# Patient Record
Sex: Male | Born: 1985
Health system: Southern US, Community
[De-identification: ages and names within clinical notes are randomized; demographics above are authoritative.]

## PROBLEM LIST (undated history)

## (undated) ENCOUNTER — Emergency Department (HOSPITAL_COMMUNITY)

---

## 2011-10-18 ENCOUNTER — Encounter (HOSPITAL_COMMUNITY): Payer: Self-pay | Admitting: *Deleted

## 2011-10-18 ENCOUNTER — Emergency Department (HOSPITAL_COMMUNITY)
Admission: EM | Admit: 2011-10-18 | Discharge: 2011-10-18 | Discharge status: Against Medical Advice | Payer: Self-pay | Attending: Emergency Medicine | Admitting: Emergency Medicine

## 2011-10-18 DIAGNOSIS — R319 Hematuria, unspecified: Secondary | ICD-10-CM | POA: Insufficient documentation

## 2011-10-18 DIAGNOSIS — R109 Unspecified abdominal pain: Secondary | ICD-10-CM | POA: Insufficient documentation

## 2011-10-18 LAB — CBC WITH DIFFERENTIAL/PLATELET
Basophils Absolute: 0 10*3/uL (ref 0.0–0.1)
Eosinophils Absolute: 0.1 10*3/uL (ref 0.0–0.7)
Lymphocytes Relative: 15 % (ref 12–46)
Lymphs Abs: 1.8 10*3/uL (ref 0.7–4.0)
MCH: 32.7 pg (ref 26.0–34.0)
Neutrophils Relative %: 78 % — ABNORMAL HIGH (ref 43–77)
Platelets: 184 10*3/uL (ref 150–400)
RBC: 3.91 MIL/uL — ABNORMAL LOW (ref 4.22–5.81)
RDW: 11.9 % (ref 11.5–15.5)
WBC: 11.7 10*3/uL — ABNORMAL HIGH (ref 4.0–10.5)

## 2011-10-18 LAB — COMPREHENSIVE METABOLIC PANEL
ALT: 14 U/L (ref 0–53)
AST: 23 U/L (ref 0–37)
Albumin: 4.2 g/dL (ref 3.5–5.2)
Alkaline Phosphatase: 41 U/L (ref 39–117)
Calcium: 9.4 mg/dL (ref 8.4–10.5)
GFR calc Af Amer: >90 mL/min (ref >90)
Glucose, Bld: 94 mg/dL (ref 70–99)
Potassium: 3.8 mEq/L (ref 3.5–5.1)
Sodium: 139 mEq/L (ref 135–145)
Total Protein: 7.6 g/dL (ref 6.0–8.3)

## 2011-10-18 LAB — URINALYSIS, ROUTINE W REFLEX MICROSCOPIC
Nitrite: NEGATIVE
Protein, ur: NEGATIVE mg/dL
Specific Gravity, Urine: 1.024 (ref 1.005–1.030)
Urobilinogen, UA: 1 mg/dL (ref 0.0–1.0)

## 2011-10-18 LAB — URINE MICROSCOPIC-ADD ON

## 2011-10-18 NOTE — ED Notes (Signed)
The pt has had some rt flank and rt abd pain since Saturday.  He thinks he saw blood innhis urine today.  No previous history

## 2011-10-18 NOTE — ED Notes (Signed)
Patient had to leave due to work,  Unable to wait any longer.  He has plans to return in the morning.  Attempted to sign the ama form but signature pad did not work.  He is aware of risks associated with leaving

## 2011-12-01 ENCOUNTER — Emergency Department (INDEPENDENT_AMBULATORY_CARE_PROVIDER_SITE_OTHER)
Admission: EM | Admit: 2011-12-01 | Discharge: 2011-12-01 | Disposition: A | Discharge status: Home / Self Care | Payer: Self-pay | Source: Home / Self Care | Attending: Emergency Medicine | Admitting: Emergency Medicine

## 2011-12-01 ENCOUNTER — Encounter (HOSPITAL_COMMUNITY): Payer: Self-pay | Admitting: *Deleted

## 2011-12-01 DIAGNOSIS — L02416 Cutaneous abscess of left lower limb: Secondary | ICD-10-CM

## 2011-12-01 DIAGNOSIS — L02419 Cutaneous abscess of limb, unspecified: Secondary | ICD-10-CM

## 2011-12-01 MED ORDER — LIDOCAINE HCL (PF) 1 % IJ SOLN
INTRAMUSCULAR | Status: AC
  Filled 2011-12-01: qty 5

## 2011-12-01 MED ORDER — ACETAMINOPHEN 325 MG PO TABS
ORAL_TABLET | ORAL | Status: AC
  Filled 2011-12-01: qty 2

## 2011-12-01 MED ORDER — ACETAMINOPHEN 325 MG PO TABS
650.0000 mg | ORAL_TABLET | Freq: Once | ORAL | Status: AC
Start: 2011-12-01 — End: 2011-12-01
  Administered 2011-12-01: 650 mg via ORAL

## 2011-12-01 MED ORDER — BACITRACIN 500 UNIT/GM EX OINT
1.0000 "application " | TOPICAL_OINTMENT | Freq: Once | CUTANEOUS | Status: AC
  Administered 2011-12-01: 1 via TOPICAL

## 2011-12-01 MED ORDER — HYDROCODONE-ACETAMINOPHEN 5-325 MG PO TABS: 2.0000 | ORAL_TABLET | ORAL | Status: AC | PRN

## 2011-12-01 MED ORDER — IBUPROFEN 600 MG PO TABS: 600.0000 mg | ORAL_TABLET | Freq: Four times a day (QID) | ORAL | Status: AC | PRN

## 2011-12-01 MED ORDER — CEFTRIAXONE SODIUM 1 G IJ SOLR
INTRAMUSCULAR | Status: AC
  Filled 2011-12-01: qty 10

## 2011-12-01 MED ORDER — SULFAMETHOXAZOLE-TRIMETHOPRIM 800-160 MG PO TABS: 1.0000 | ORAL_TABLET | Freq: Two times a day (BID) | ORAL | Status: AC

## 2011-12-01 MED ORDER — CEFTRIAXONE SODIUM 1 G IJ SOLR
1.0000 g | Freq: Once | INTRAMUSCULAR | Status: AC
  Administered 2011-12-01: 1 g via INTRAMUSCULAR

## 2011-12-01 NOTE — ED Notes (Signed)
No adverse reaction to injection.  

## 2011-12-01 NOTE — ED Notes (Signed)
Pt with redness swelling above left knee onset yesterday am - squeezed last night some drainage - whit[place] center

## 2011-12-01 NOTE — ED Provider Notes (Signed)
History     CSN: [id number]  Arrival date & time 12/01/11  1614   First MD Initiated Contact with Patient 12/01/11 1737      Chief Complaint  Patient presents with  . Abscess  . Cellulitis    (Consider location/radiation/quality/duration/timing/severity/associated sxs/prior treatment) HPI Comments: Pt with painful erythematous mass of gradually increasing size starting on his left thigh yesterday. Thinks he was bitten by "something' but is not sure what. Expressed purulent drainage this am. No N/V, fevers at home.  Has not tried anything for pain. Pt is a smoker. Not a diabetic.   ROS as noted in HPI. All other ROS negative.     Patient is a 26 y.o. male presenting with abscess. The history is provided by the patient. No language interpreter was used.  Abscess  This is a new problem. The current episode started yesterday. The onset was sudden. The problem has been gradually worsening. The abscess is present on the left upper leg. The problem is mild. The abscess is characterized by redness and painfulness. It is unknown what he was exposed to. Associated symptoms include a fever.    History reviewed. No pertinent past medical history.  History reviewed. No pertinent past surgical history.  History reviewed. No pertinent family history.  History  Substance Use Topics  . Smoking status: Current Everyday Smoker  . Smokeless tobacco: Not on file  . Alcohol Use: Yes      Review of Systems  Constitutional: Positive for fever.    Allergies  Review of patient's allergies indicates no known allergies.  Home Medications   Current Outpatient Rx  Name Route Sig Dispense Refill  . HYDROCODONE-ACETAMINOPHEN 5-325 MG PO TABS Oral Take 2 tablets by mouth every 4 (four) hours as needed for pain. 20 tablet 0  . IBUPROFEN 600 MG PO TABS Oral Take 1 tablet (600 mg total) by mouth every 6 (six) hours as needed for pain. 30 tablet 0  . SULFAMETHOXAZOLE-TRIMETHOPRIM 800-160 MG PO  TABS Oral Take 1 tablet by mouth 2 (two) times daily. X 10 days 20 tablet 0    BP 129/81  Pulse 90  Temp 101.7 F (38.7 C) (Oral)  Resp 20  SpO2 97%  Physical Exam  Nursing note and vitals reviewed. Constitutional: He is oriented to person, place, and time. He appears well-developed and well-nourished.  HENT:  Head: Normocephalic and atraumatic.  Eyes: Conjunctivae and EOM are normal.  Neck: Normal range of motion.  Cardiovascular: Normal rate.   Pulmonary/Chest: Effort normal. No respiratory distress.  Abdominal: He exhibits no distension.  Musculoskeletal: Normal range of motion.       7x 7.5 cm are induration, erythema tenderness L anterior thigh with central pustule  Neurological: He is alert and oriented to person, place, and time. Coordination normal.  Skin: Skin is warm and dry.  Psychiatric: He has a normal mood and affect. His behavior is normal. Judgment and thought content normal.    ED Course  INCISION AND DRAINAGE Date/Time: 12/01/2011 6:24 PM Performed by: Luiz Blare Authorized by: Luiz Blare Consent: Verbal consent obtained. Consent given by: patient Patient understanding: patient states understanding of the procedure being performed Patient consent: the patient's understanding of the procedure matches consent given Site marked: the operative site was marked Required items: required blood products, implants, devices, and special equipment available Patient identity confirmed: verbally with patient Time out: Immediately prior to procedure a "time out" was called to verify the correct patient, procedure, equipment, support  staff and site/side marked as required. Type: abscess Body area: lower extremity Location details: left leg Local anesthetic: co-phenylcaine spray Patient sedated: no Scalpel size: 11 Incision type: cruciate. Complexity: simple Drainage: purulent Drainage amount: scant Wound treatment: wound left open Patient  tolerance: Patient tolerated the procedure well with no immediate complications. Comments: Sent material off for cx.    (including critical care time)   Labs Reviewed  CULTURE, ROUTINE-ABSCESS   No results found.   1. Abscess of left thigh     MDM  Pt febrile gave tylenol, 1 gm rocephin, home on bactrim. f/u in 2 days. Discussed sx/sn that should prompt earlier return. Patient agrees with plan.    Luiz Blare, MD 12/03/11 2226

## 2011-12-04 LAB — CULTURE, ROUTINE-ABSCESS

## 2011-12-10 ENCOUNTER — Telehealth (HOSPITAL_COMMUNITY): Payer: Self-pay | Admitting: *Deleted

## 2011-12-10 NOTE — ED Notes (Signed)
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  Pt. adequately treated with Septra DS. I called, but pt. was unavailable. I called contact Mom Forde Dandy and left a message for pt. to call. [name] 12/10/2011

## 2011-12-11 ENCOUNTER — Telehealth (HOSPITAL_COMMUNITY): Payer: Self-pay | Admitting: *Deleted

## 2011-12-11 NOTE — ED Notes (Signed)
Pt. called back. Pt. verified x 2 and given results. Pt. told he was adequately treated with Septra DS.  MRSA instructions given.  Pt. voiced understanding. [name] 12/11/2011

## 2018-04-20 ENCOUNTER — Emergency Department
Admission: EM | Admit: 2018-04-20 | Discharge: 2018-04-20 | Disposition: A | Discharge status: Home / Self Care | Payer: Self-pay | Attending: Emergency Medicine | Admitting: Emergency Medicine

## 2018-04-20 ENCOUNTER — Encounter: Payer: Self-pay | Admitting: Medical Oncology

## 2018-04-20 DIAGNOSIS — J111 Influenza due to unidentified influenza virus with other respiratory manifestations: Secondary | ICD-10-CM | POA: Insufficient documentation

## 2018-04-20 DIAGNOSIS — F17200 Nicotine dependence, unspecified, uncomplicated: Secondary | ICD-10-CM | POA: Insufficient documentation

## 2018-04-20 DIAGNOSIS — R69 Illness, unspecified: Secondary | ICD-10-CM

## 2018-04-20 LAB — INFLUENZA PANEL BY PCR (TYPE A & B)
INFLAPCR: NEGATIVE
INFLBPCR: NEGATIVE

## 2018-04-20 MED ORDER — ONDANSETRON 4 MG PO TBDP
4.0000 mg | ORAL_TABLET | Freq: Once | ORAL | Status: AC
  Administered 2018-04-20: 4 mg via ORAL
  Filled 2018-04-20: qty 1

## 2018-04-20 MED ORDER — BENZONATATE 100 MG PO CAPS: ORAL_CAPSULE | ORAL | 0 refills | Status: DC

## 2018-04-20 MED ORDER — ONDANSETRON 4 MG PO TBDP: 4.0000 mg | ORAL_TABLET | Freq: Three times a day (TID) | ORAL | 0 refills | Status: DC | PRN

## 2018-04-20 MED ORDER — PREDNISONE 10 MG PO TABS: 10.0000 mg | ORAL_TABLET | Freq: Two times a day (BID) | ORAL | 0 refills | Status: DC

## 2018-04-20 MED ORDER — OSELTAMIVIR PHOSPHATE 75 MG PO CAPS: 75.0000 mg | ORAL_CAPSULE | Freq: Two times a day (BID) | ORAL | 0 refills | Status: DC

## 2018-04-20 MED ORDER — OSELTAMIVIR PHOSPHATE 75 MG PO CAPS: 75.0000 mg | ORAL_CAPSULE | Freq: Two times a day (BID) | ORAL | 0 refills | Status: AC

## 2018-04-20 NOTE — ED Triage Notes (Signed)
Pt reports flu sx's x 3 days.

## 2018-04-20 NOTE — ED Notes (Signed)
E signature pad not working in room. Pt verbalized understanding of follow-up and discharge instructions as well a medications prescribed

## 2018-04-20 NOTE — ED Provider Notes (Signed)
[facility] Emergency Department Provider Note ____________________________________________  Time seen: 1810  I have reviewed the triage vital signs and the nursing notes.  HISTORY  Chief Complaint  Influenza  HPI Willie Jacobs is a 33 y.o. male presents himself to the ED for evaluation of flulike symptoms.  Patient describes sudden onset of fever at about 1:00 in the morning on he reports a [place]-max of 10105 F.  He took fever reducer at that time is not had recorded fevers at that level since.  He does report subjective fevers, chills, sweats, body aches, and has had intermittent nausea and vomiting.  He did not receive the seasonal flu vaccine.  History reviewed. No pertinent past medical history.  There are no active problems to display for this patient.  No past surgical history on file.  Prior to Admission medications   Medication Sig Start Date End Date Taking? Authorizing Provider  benzonatate (TESSALON PERLES) 100 MG capsule Take 1-2 tabs TID prn cough 04/20/18   Rhayne Chatwin, Charlesetta IvoryJenise V Bacon, PA-C  ondansetron (ZOFRAN ODT) 4 MG disintegrating tablet Take 1 tablet (4 mg total) by mouth every 8 (eight) hours as needed. 04/20/18   Zaryan Yakubov, Charlesetta IvoryJenise V Bacon, PA-C  oseltamivir (TAMIFLU) 75 MG capsule Take 1 capsule (75 mg total) by mouth 2 (two) times daily for 5 days. 04/20/18 04/25/18  Mayling Aber, Charlesetta IvoryJenise V Bacon, PA-C  predniSONE (DELTASONE) 10 MG tablet Take 1 tablet (10 mg total) by mouth 2 (two) times daily with a meal. 04/20/18   Krew Hortman, Charlesetta IvoryJenise V Bacon, PA-C    Allergies Patient has no known allergies.  No family history on file.  Social History Social History   Tobacco Use  . Smoking status: Current Every Day Smoker  Substance Use Topics  . Alcohol use: Yes  . Drug use: No    Review of Systems  Constitutional: Positive for fever. Eyes: Negative for visual changes. ENT: Negative for sore throat. Cardiovascular: Negative for chest pain. Respiratory:  Negative for shortness of breath.  Reports cough and congestion. Gastrointestinal: Negative for abdominal pain, and diarrhea.  Reports nausea and vomiting. Genitourinary: Negative for dysuria. Musculoskeletal: Negative for back pain.  Ports generalized body aches. Skin: Negative for rash. Neurological: Negative for headaches, focal weakness or numbness. ____________________________________________  PHYSICAL EXAM:  VITAL SIGNS: ED Triage Vitals  Enc Vitals Group     BP 04/20/18 1658 122/90     Pulse Rate 04/20/18 1658 (!) 102     Resp 04/20/18 1658 16     Temp 04/20/18 1658 98.2 F (36.8 C)     Temp Source 04/20/18 1658 Oral     SpO2 04/20/18 1658 96 %     Weight 04/20/18 1650 220 lb (99.8 kg)     Height 04/20/18 1650 6\' 2"  (1.88 m)     Head Circumference --      Peak Flow --      Pain Score 04/20/18 1650 9     Pain Loc --      Pain Edu? --      Excl. in GC? --     Constitutional: Alert and oriented. Well appearing and in no distress. Head: Normocephalic and atraumatic. Eyes: Conjunctivae are normal. Normal extraocular movements Neck: Supple. No thyromegaly. Hematological/Lymphatic/Immunological: No cervical lymphadenopathy. Cardiovascular: Normal rate, regular rhythm. Normal distal pulses. Respiratory: Normal respiratory effort. No wheezes/rales/rhonchi. Gastrointestinal: Soft and nontender. No distention. Musculoskeletal: Nontender with normal range of motion in all extremities.  Neurologic:  Normal gait without ataxia. Normal speech  and language. No gross focal neurologic deficits are appreciated. Skin:  Skin is warm, dry and intact. No rash noted. ____________________________________________   LABS (pertinent positives/negatives) Labs Reviewed  INFLUENZA PANEL BY PCR (TYPE A & B)  ____________________________________________  PROCEDURES  Procedures Zofran 4 mg DOT ____________________________________________  INITIAL IMPRESSION / ASSESSMENT AND PLAN / ED  COURSE  Patient with ED evaluation of flulike symptoms.  Patient presents with reports of fevers, body aches, nausea, vomiting, and chills.  Was influenza screen is negative at this time.  Patient will be treated empirically for influenza.  A prescription for Tamiflu is provided to take as directed.  He is also given prescriptions for Tessalon Perles, Zofran, and prednisone.  He is encouraged to set up routine care 1 of the local community clinics for ongoing symptoms.  Return to the ED as needed. ____________________________________________  FINAL CLINICAL IMPRESSION(S) / ED DIAGNOSES  Final diagnoses:  Influenza-like illness      Karmen Stabs, Charlesetta Ivory, PA-C 04/20/18 Kermit Balo, MD 04/20/18 [phone]

## 2018-04-20 NOTE — Discharge Instructions (Addendum)
You are being treated for probable influenza (flu). Take the prescription Tamiflu as directed. Take the other meds as directed. Follow-up with one of the local community clinics as needed. You may take Delsym, DayQuil, and NyQuil in addition. Return to the ED as needed.

## 2018-04-23 ENCOUNTER — Ambulatory Visit (HOSPITAL_COMMUNITY)
Admission: EM | Admit: 2018-04-23 | Discharge: 2018-04-23 | Disposition: A | Discharge status: Home / Self Care | Payer: Self-pay | Attending: Family Medicine | Admitting: Family Medicine

## 2018-04-23 ENCOUNTER — Encounter (HOSPITAL_COMMUNITY): Payer: Self-pay

## 2018-04-23 DIAGNOSIS — S40021A Contusion of right upper arm, initial encounter: Secondary | ICD-10-CM

## 2018-04-23 DIAGNOSIS — M542 Cervicalgia: Secondary | ICD-10-CM

## 2018-04-23 MED ORDER — NAPROXEN 500 MG PO TABS: 500.0000 mg | ORAL_TABLET | Freq: Two times a day (BID) | ORAL | 0 refills | Status: DC
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
## 2018-04-23 NOTE — ED Notes (Signed)
Patient able to ambulate independently  
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
## 2018-04-23 NOTE — ED Notes (Signed)
Bed: UC01 Expected date:  Expected time:  Means of arrival:  Comments: [place]

## 2018-05-03 ENCOUNTER — Encounter (HOSPITAL_COMMUNITY): Payer: Self-pay

## 2018-05-03 ENCOUNTER — Other Ambulatory Visit: Payer: Self-pay

## 2018-05-03 ENCOUNTER — Ambulatory Visit (HOSPITAL_COMMUNITY)
Admission: EM | Admit: 2018-05-03 | Discharge: 2018-05-03 | Disposition: A | Discharge status: Home / Self Care | Payer: Self-pay | Attending: Family Medicine | Admitting: Family Medicine

## 2018-05-03 DIAGNOSIS — M62830 Muscle spasm of back: Secondary | ICD-10-CM

## 2018-05-03 MED ORDER — CYCLOBENZAPRINE HCL 10 MG PO TABS: 10.0000 mg | ORAL_TABLET | Freq: Every day | ORAL | 0 refills | Status: DC

## 2018-05-03 NOTE — Discharge Instructions (Signed)
Rest, ice heat, and gentle massage as needed Ensure adequate ROM as tolerated. Injuries all appear to be muscular in nature. Continue with naproxen Prescribed flexeril as needed at bedtime for muscle spasm.  Do not drive or operate heavy machinery while taking this medication It may take 3-4 weeks for complete resolution of symptoms Will f/u with his doctor or here if not seeing significant improvement within one week. Return here or go to ER if you have any new or worsening symptoms such as numbness/tingling of the inner thighs, loss of bladder or bowel control, headache/blurry vision, nausea/vomiting, confusion/altered mental status, dizziness, weakness, passing out, imbalance, etc..Marland Kitchen
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
## 2018-05-03 NOTE — ED Notes (Signed)
Patient requested work note be extended one day, brittany, [place] agreed

## 2018-05-03 NOTE — ED Notes (Signed)
Stated to patient that we do not fill out "fmla or family leave absence" paperwork.  Encouraged patient to talk to employer and make sure this was not an expectation.  Patient stated understanding.
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
## 2019-03-23 ENCOUNTER — Ambulatory Visit: Payer: Self-pay

## 2019-09-07 ENCOUNTER — Encounter (HOSPITAL_COMMUNITY): Payer: Self-pay | Admitting: Emergency Medicine

## 2019-09-07 ENCOUNTER — Ambulatory Visit (HOSPITAL_COMMUNITY)
Admission: EM | Admit: 2019-09-07 | Discharge: 2019-09-07 | Disposition: A | Discharge status: Home / Self Care | Payer: Self-pay | Attending: Family Medicine | Admitting: Family Medicine

## 2019-09-07 ENCOUNTER — Other Ambulatory Visit: Payer: Self-pay

## 2019-09-07 DIAGNOSIS — R112 Nausea with vomiting, unspecified: Secondary | ICD-10-CM

## 2019-09-07 MED ORDER — ONDANSETRON 4 MG PO TBDP: 4.0000 mg | ORAL_TABLET | Freq: Three times a day (TID) | ORAL | 0 refills | Status: DC | PRN

## 2019-09-07 NOTE — Discharge Instructions (Signed)
Please do your best to ensure adequate fluid intake in order to avoid dehydration. If you find that you are unable to tolerate drinking fluids regularly please proceed to the Emergency Department for evaluation.  Also, you should return to the hospital if you experience abdominal pain that persists despite medications, persistent vomiting and diarrhea, dizziness, syncope (fainting), or for any other concerns you may find worrisome.

## 2019-09-07 NOTE — ED Triage Notes (Signed)
Pt states over the weekend he had a stomach virus over the weekend and vomitted twice. No other symptoms. Denies any nausea/diarrhea. Pt states he feels better today but does need a work note.

## 2019-09-09 NOTE — ED Provider Notes (Signed)
[facility]   [id number] 09/07/19 Arrival Time: 7342  ASSESSMENT & PLAN:  1. Non-intractable vomiting with nausea, unspecified vomiting type     Work note provided.   Discharge Instructions     Please do your best to ensure adequate fluid intake in order to avoid dehydration. If you find that you are unable to tolerate drinking fluids regularly please proceed to the Emergency Department for evaluation.  Also, you should return to the hospital if you experience abdominal pain that persists despite medications, persistent vomiting and diarrhea, dizziness, syncope (fainting), or for any other concerns you may find worrisome.      Meds ordered this encounter  Medications  . ondansetron (ZOFRAN-ODT) 4 MG disintegrating tablet    Sig: Take 1 tablet (4 mg total) by mouth every 8 (eight) hours as needed for nausea or vomiting.    Dispense:  15 tablet    Refill:  0    Discussed typical duration of symptoms for suspected viral GI illness. Will do his best to ensure adequate fluid intake in order to avoid dehydration. Will proceed to the Emergency Department for evaluation if unable to tolerate PO fluids regularly.  Otherwise he will f/u with his PCP or here if not showing improvement over the next 48-72 hours.  Reviewed expectations re: course of current medical issues. Questions answered. Outlined signs and symptoms indicating need for more acute intervention. Patient verbalized understanding. After Visit Summary given.   SUBJECTIVE: History from: patient.  Willie Jacobs is a 34 y.o. male who reports that he "had a stomach virus over the weekend"; nausea/vomiting without diarrhea. Overall feeling better. Tolerating PO fluids with just occasional and mild nausea; no further emesis. No abdominal pain reported. Afebrile. No specific aggravating or alleviating factors reported. No known sick contacts. Needs a note to return to work.    OBJECTIVE:  Vitals:   09/07/19 1315  BP: 115/71  Pulse: 72  Resp: 14  Temp: 98.5 F (36.9 C)  TempSrc: Oral  SpO2: 100%    General appearance: alert; no distress Oropharynx: moist Lungs: speaks full sentences without difficutly; unlabored Abdomen: soft; non-distended; no significant abdominal tenderness; bowel sounds present; no masses or organomegaly; no guarding or rebound tenderness Back: no CVA tenderness reported Extremities: no edema; symmetrical with no gross deformities Skin: warm; dry Neurologic: normal gait Psychological: alert and cooperative; normal mood and affect   No Known Allergies                                             History reviewed. No pertinent past medical history. Social History   Socioeconomic History  . Marital status: Single    Spouse name: Not on file  . Number of children: Not on file  . Years of education: Not on file  . Highest education level: Not on file  Occupational History  . Not on file  Tobacco Use  . Smoking status: Current Every Day Smoker  . Smokeless tobacco: Never Used  Substance and Sexual Activity  . Alcohol use: Yes  . Drug use: No  . Sexual activity: Not on file  Other Topics Concern  . Not on file  Social History Narrative  . Not on file   Social Determinants of Health   Financial Resource Strain:   . Difficulty of Paying Living Expenses:   Food Insecurity:   . Worried About  Running Out of Food in the Last Year:   . Ran Out of Food in the Last Year:   Transportation Needs:   . Lack of Transportation (Medical):   Marland Kitchen Lack of Transportation (Non-Medical):   Physical Activity:   . Days of Exercise per Week:   . Minutes of Exercise per Session:   Stress:   . Feeling of Stress :   Social Connections:   . Frequency of Communication with Friends and Family:   . Frequency of Social Gatherings with Friends and Family:   . Attends Religious Services:   . Active Member of Clubs or Organizations:   . Attends Banker  Meetings:   Marland Kitchen Marital Status:   Intimate Partner Violence:   . Fear of Current or Ex-Partner:   . Emotionally Abused:   Marland Kitchen Physically Abused:   . Sexually Abused:    History reviewed. No pertinent family history.   Mardella Layman, MD 09/09/19 1419

## 2020-03-29 ENCOUNTER — Ambulatory Visit (HOSPITAL_COMMUNITY)
Admission: EM | Admit: 2020-03-29 | Discharge: 2020-03-29 | Disposition: A | Discharge status: Home / Self Care | Payer: Self-pay | Attending: Family Medicine | Admitting: Family Medicine

## 2020-03-29 ENCOUNTER — Encounter (HOSPITAL_COMMUNITY): Payer: Self-pay

## 2020-03-29 ENCOUNTER — Other Ambulatory Visit: Payer: Self-pay

## 2020-03-29 DIAGNOSIS — S20211A Contusion of right front wall of thorax, initial encounter: Secondary | ICD-10-CM

## 2020-03-29 MED ORDER — IBUPROFEN 800 MG PO TABS: 800.0000 mg | ORAL_TABLET | Freq: Three times a day (TID) | ORAL | 0 refills | Status: DC

## 2020-03-29 NOTE — ED Triage Notes (Signed)
Pt presents right side pain. Pt states he was wrestling with his brother today and injured himself.
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
## 2021-01-25 ENCOUNTER — Other Ambulatory Visit: Payer: Self-pay

## 2021-01-25 ENCOUNTER — Emergency Department (HOSPITAL_COMMUNITY)
Admission: EM | Admit: 2021-01-25 | Discharge: 2021-01-25 | Disposition: A | Discharge status: Home / Self Care | Payer: No Typology Code available for payment source | Attending: Emergency Medicine | Admitting: Emergency Medicine

## 2021-01-25 ENCOUNTER — Encounter (HOSPITAL_COMMUNITY): Payer: Self-pay | Admitting: General Surgery

## 2021-01-25 ENCOUNTER — Emergency Department (HOSPITAL_COMMUNITY): Payer: No Typology Code available for payment source

## 2021-01-25 DIAGNOSIS — R7402 Elevation of levels of lactic acid dehydrogenase (LDH): Secondary | ICD-10-CM | POA: Diagnosis not present

## 2021-01-25 DIAGNOSIS — Y908 Blood alcohol level of 240 mg/100 ml or more: Secondary | ICD-10-CM | POA: Diagnosis not present

## 2021-01-25 DIAGNOSIS — Z23 Encounter for immunization: Secondary | ICD-10-CM | POA: Diagnosis not present

## 2021-01-25 DIAGNOSIS — F10129 Alcohol abuse with intoxication, unspecified: Secondary | ICD-10-CM | POA: Insufficient documentation

## 2021-01-25 DIAGNOSIS — Z20822 Contact with and (suspected) exposure to covid-19: Secondary | ICD-10-CM | POA: Diagnosis not present

## 2021-01-25 DIAGNOSIS — E876 Hypokalemia: Secondary | ICD-10-CM | POA: Insufficient documentation

## 2021-01-25 DIAGNOSIS — S2232XA Fracture of one rib, left side, initial encounter for closed fracture: Secondary | ICD-10-CM | POA: Insufficient documentation

## 2021-01-25 DIAGNOSIS — S3993XA Unspecified injury of pelvis, initial encounter: Secondary | ICD-10-CM | POA: Diagnosis not present

## 2021-01-25 DIAGNOSIS — F10920 Alcohol use, unspecified with intoxication, uncomplicated: Secondary | ICD-10-CM

## 2021-01-25 DIAGNOSIS — R4182 Altered mental status, unspecified: Secondary | ICD-10-CM | POA: Diagnosis not present

## 2021-01-25 DIAGNOSIS — S42154A Nondisplaced fracture of neck of scapula, right shoulder, initial encounter for closed fracture: Secondary | ICD-10-CM | POA: Diagnosis not present

## 2021-01-25 DIAGNOSIS — S42301A Unspecified fracture of shaft of humerus, right arm, initial encounter for closed fracture: Secondary | ICD-10-CM | POA: Diagnosis not present

## 2021-01-25 DIAGNOSIS — M25511 Pain in right shoulder: Secondary | ICD-10-CM | POA: Diagnosis not present

## 2021-01-25 DIAGNOSIS — S42144A Nondisplaced fracture of glenoid cavity of scapula, right shoulder, initial encounter for closed fracture: Secondary | ICD-10-CM | POA: Insufficient documentation

## 2021-01-25 DIAGNOSIS — Y92410 Unspecified street and highway as the place of occurrence of the external cause: Secondary | ICD-10-CM | POA: Diagnosis not present

## 2021-01-25 DIAGNOSIS — S4991XA Unspecified injury of right shoulder and upper arm, initial encounter: Secondary | ICD-10-CM | POA: Diagnosis present

## 2021-01-25 DIAGNOSIS — R7989 Other specified abnormal findings of blood chemistry: Secondary | ICD-10-CM

## 2021-01-25 DIAGNOSIS — S42141A Displaced fracture of glenoid cavity of scapula, right shoulder, initial encounter for closed fracture: Secondary | ICD-10-CM

## 2021-01-25 DIAGNOSIS — S42151A Displaced fracture of neck of scapula, right shoulder, initial encounter for closed fracture: Secondary | ICD-10-CM

## 2021-01-25 LAB — URINALYSIS, ROUTINE W REFLEX MICROSCOPIC
Bilirubin Urine: NEGATIVE
Glucose, UA: NEGATIVE mg/dL
Ketones, ur: NEGATIVE mg/dL
Leukocytes,Ua: NEGATIVE
Nitrite: NEGATIVE
Protein, ur: NEGATIVE mg/dL
Specific Gravity, Urine: 1.039 — ABNORMAL HIGH (ref 1.005–1.030)
pH: 6 (ref 5.0–8.0)

## 2021-01-25 LAB — SAMPLE TO BLOOD BANK

## 2021-01-25 LAB — RESP PANEL BY RT-PCR (FLU A&B, COVID) ARPGX2
Influenza A by PCR: NEGATIVE
Influenza B by PCR: NEGATIVE
SARS Coronavirus 2 by RT PCR: NEGATIVE

## 2021-01-25 LAB — COMPREHENSIVE METABOLIC PANEL
ALT: 24 U/L (ref 0–44)
AST: 37 U/L (ref 15–41)
Albumin: 4.1 g/dL (ref 3.5–5.0)
Alkaline Phosphatase: 46 U/L (ref 38–126)
Anion gap: 12 (ref 5–15)
BUN: 8 mg/dL (ref 6–20)
CO2: 22 mmol/L (ref 22–32)
Calcium: 8.7 mg/dL — ABNORMAL LOW (ref 8.9–10.3)
Chloride: 104 mmol/L (ref 98–111)
Creatinine, Ser: 1 mg/dL (ref 0.61–1.24)
GFR, Estimated: >60 mL/min (ref >60)
Glucose, Bld: 125 mg/dL — ABNORMAL HIGH (ref 70–99)
Potassium: 3.3 mmol/L — ABNORMAL LOW (ref 3.5–5.1)
Sodium: 138 mmol/L (ref 135–145)
Total Bilirubin: 0.7 mg/dL (ref 0.3–1.2)
Total Protein: 7.6 g/dL (ref 6.5–8.1)

## 2021-01-25 LAB — I-STAT CHEM 8, ED
BUN: 8 mg/dL (ref 6–20)
Calcium, Ion: 0.9 mmol/L — ABNORMAL LOW (ref 1.15–1.40)
Chloride: 107 mmol/L (ref 98–111)
Creatinine, Ser: 1.4 mg/dL — ABNORMAL HIGH (ref 0.61–1.24)
Glucose, Bld: 121 mg/dL — ABNORMAL HIGH (ref 70–99)
HCT: 45 % (ref 39.0–52.0)
Hemoglobin: 15.3 g/dL (ref 13.0–17.0)
Potassium: 3.3 mmol/L — ABNORMAL LOW (ref 3.5–5.1)
Sodium: 142 mmol/L (ref 135–145)
TCO2: 23 mmol/L (ref 22–32)

## 2021-01-25 LAB — LACTIC ACID, PLASMA: Lactic Acid, Venous: 3 mmol/L (ref 0.5–1.9)

## 2021-01-25 LAB — PROTIME-INR
INR: 1 (ref 0.8–1.2)
Prothrombin Time: 13.4 seconds (ref 11.4–15.2)

## 2021-01-25 LAB — CBC
HCT: 42.3 % (ref 39.0–52.0)
Hemoglobin: 14.2 g/dL (ref 13.0–17.0)
MCH: 33.8 pg (ref 26.0–34.0)
MCHC: 33.6 g/dL (ref 30.0–36.0)
MCV: 100.7 fL — ABNORMAL HIGH (ref 80.0–100.0)
Platelets: 254 10*3/uL (ref 150–400)
RBC: 4.2 MIL/uL — ABNORMAL LOW (ref 4.22–5.81)
RDW: 11.7 % (ref 11.5–15.5)
WBC: 13.6 10*3/uL — ABNORMAL HIGH (ref 4.0–10.5)
nRBC: 0 % (ref 0.0–0.2)

## 2021-01-25 LAB — ETHANOL: Alcohol, Ethyl (B): 277 mg/dL — ABNORMAL HIGH (ref <10)

## 2021-01-25 MED ORDER — POTASSIUM CHLORIDE CRYS ER 20 MEQ PO TBCR: 20.0000 meq | EXTENDED_RELEASE_TABLET | Freq: Two times a day (BID) | ORAL | 0 refills | Status: DC

## 2021-01-25 MED ORDER — IOHEXOL 300 MG/ML  SOLN
80.0000 mL | Freq: Once | INTRAMUSCULAR | Status: AC | PRN
  Administered 2021-01-25: 80 mL via INTRAVENOUS

## 2021-01-25 MED ORDER — TETANUS-DIPHTH-ACELL PERTUSSIS 5-2.5-18.5 LF-MCG/0.5 IM SUSY
0.5000 mL | PREFILLED_SYRINGE | Freq: Once | INTRAMUSCULAR | Status: AC
  Administered 2021-01-25: 0.5 mL via INTRAMUSCULAR
  Filled 2021-01-25: qty 0.5

## 2021-01-25 MED ORDER — HYDROCODONE-ACETAMINOPHEN 5-325 MG PO TABS: 1.0000 | ORAL_TABLET | Freq: Four times a day (QID) | ORAL | 0 refills | Status: DC | PRN

## 2021-01-25 MED ORDER — LACTATED RINGERS IV BOLUS
2000.0000 mL | Freq: Once | INTRAVENOUS | Status: AC
  Administered 2021-01-25: 2000 mL via INTRAVENOUS

## 2021-01-25 MED ORDER — SODIUM CHLORIDE 0.9 % IV SOLN
INTRAVENOUS | Status: DC | PRN
  Administered 2021-01-25: 1000 mL via INTRAVENOUS
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
## 2021-01-25 NOTE — ED Notes (Signed)
Pt sitting on side of head eating [place] sandwich.
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
## 2021-01-25 NOTE — ED Notes (Addendum)
Pt transported to CT 2 by [facility]
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
## 2021-01-25 NOTE — ED Notes (Signed)
Attempts made to contact pts sister French Ana, [phone]. LM
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
## 2021-01-25 NOTE — Progress Notes (Signed)
   01/25/21 0400  Clinical Encounter Type  Visited With Patient not available  Visit Type Trauma  Referral From Nurse  Consult/Referral To Chaplain   Chaplain responded. The medical team is attending to the patient. There is no support person here at this time. Advise that the Chaplain remains available for follow-up spiritual and emotional support as needed.This note was prepared by Deneen Harts, M.Div..  For questions please contact by phone [phone].

## 2021-01-26 ENCOUNTER — Encounter (HOSPITAL_COMMUNITY): Payer: Self-pay

## 2021-01-28 ENCOUNTER — Other Ambulatory Visit: Payer: Self-pay

## 2021-01-28 ENCOUNTER — Encounter (HOSPITAL_COMMUNITY): Payer: Self-pay | Admitting: Emergency Medicine

## 2021-01-28 ENCOUNTER — Ambulatory Visit (HOSPITAL_COMMUNITY)
Admission: EM | Admit: 2021-01-28 | Discharge: 2021-01-28 | Disposition: A | Discharge status: Home / Self Care | Payer: Medicaid Other | Attending: Emergency Medicine | Admitting: Emergency Medicine

## 2021-01-28 DIAGNOSIS — S42411S Displaced simple supracondylar fracture without intercondylar fracture of right humerus, sequela: Secondary | ICD-10-CM

## 2021-01-28 DIAGNOSIS — S42411D Displaced simple supracondylar fracture without intercondylar fracture of right humerus, subsequent encounter for fracture with routine healing: Secondary | ICD-10-CM

## 2021-01-28 MED ORDER — IBUPROFEN 800 MG PO TABS: 800.0000 mg | ORAL_TABLET | Freq: Three times a day (TID) | ORAL | 0 refills | Status: AC

## 2021-01-28 NOTE — Discharge Instructions (Addendum)
Please begin ibuprofen 3 times daily for pain.  Please follow-up with orthopedics as scheduled
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
## 2021-01-28 NOTE — Progress Notes (Signed)
Orthopedic Tech Progress Note Patient Details:  Willie Jacobs [birth year] [id number]  The anterior long arm splint applied by Dr. Preston Fleeting in the ED on 01/25/21 was removed and replaced with a well-padded, plaster Coaptation splint and arm sling. Pt's discomfort level decreased significantly with removal and application of a different splint and also noted no areas of discomfort or irritation from the splint.   Ortho Devices Type of Ortho Device: Arm sling, Cotton web roll, Coapt, Ace wrap Ortho Device/Splint Location: RUE Ortho Device/Splint Interventions: Ordered, Application, Removal   Post Interventions Patient Tolerated: Fair Instructions Provided: Care of device, Adjustment of device, Poper ambulation with device  Docia Furl 01/28/2021, 6:25 PM

## 2021-01-28 NOTE — ED Notes (Signed)
Called ortho tech and made aware order for long arm splint to be casted.
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
## 2021-04-12 NOTE — Therapy (Incomplete)
OUTPATIENT PHYSICAL THERAPY SHOULDER EVALUATION   Patient Name: Willie Jacobs MRN: [id number] DOB:02/21/1986, 36 y.o., male Today's Date: 04/12/2021    No past medical history on file. No past surgical history on file. There are no problems to display for this patient.   PCP: Pcp, No  REFERRING PROVIDER: Terance Hart, MD  REFERRING DIAG: RIGHT HUMERUS FX  THERAPY DIAG:  No diagnosis found.   ONSET DATE: 01/25/2021  SUBJECTIVE:                                                                                                                                                                                      SUBJECTIVE STATEMENT: ***  PERTINENT HISTORY: ***  PAIN:  Are you having pain? {yes/no:20286} NPRS scale: ***/10 Pain location: *** Pain orientation: {Pain Orientation:25161}  PAIN TYPE: {type:313116} Pain description: {PAIN DESCRIPTION:21022940}  Aggravating factors: *** Relieving factors: ***  PRECAUTIONS: {Therapy precautions:24002}  WEIGHT BEARING RESTRICTIONS {Yes ***/No:24003}  FALLS:  Has patient fallen in last 6 months? {yes/no:20286} Number of falls: ***  LIVING ENVIRONMENT: Lives with: {[facility] lives with:25569::"lives with their family"} Lives in: {Lives in:25570} Stairs: {yes/no:20286}; {Stairs:24000} Has following equipment at home: {Assistive devices:23999}  OCCUPATION: ***  PLOF: {PLOF:24004}  PATIENT GOALS ***  OBJECTIVE:   DIAGNOSTIC FINDINGS:  01/25/2021 DG Humerus Right: IMPRESSION: 1. Acute angulated transverse fracture involving the mid-distal right humeral shaft. 2. Question additional lucency traversing the glenoid, which could reflect an additional acute nondisplaced fracture. Further assessment with dedicated radiograph of the right shoulder suggested.  01/25/2021 DG Shoulder Right: IMPRESSION: 1. Subtle nondisplaced fracture of the right scapula extending from the glenoid into the body of the scapula,  well appreciated on the CT of the chest 01/25/2021. 2. Acute minimally comminuted displaced and angulated fracture of the mid right humeral diaphysis again noted.   COGNITION:  Overall cognitive status: {cognition:24006}     SENSATION:  Light touch: {intact/deficits:24005}    POSTURE: ***  PALPATION: ***  UPPER EXTREMITY AROM/PROM:  A/PROM Right 04/12/2021 Left 04/12/2021  Shoulder flexion    Shoulder extension    Shoulder abduction    Shoulder adduction    Shoulder internal rotation    Shoulder external rotation    Elbow flexion    Elbow extension    Wrist flexion    Wrist extension    Wrist ulnar deviation    Wrist radial deviation    Wrist pronation    Wrist supination    (Blank rows = not tested)  UPPER EXTREMITY MMT:  MMT Right 04/12/2021 Left 04/12/2021  Shoulder flexion    Shoulder extension    Shoulder abduction    Shoulder adduction  Middle trapezius    Lower trapezius    Elbow flexion    Elbow extension    Wrist flexion    Wrist extension    Wrist ulnar deviation    Wrist radial deviation    Wrist pronation    Wrist supination    Grip strength (lbs)    (Blank rows = not tested)   JOINT MOBILITY TESTING:  ***  PALPATION:  ***  POSTURE:  ***   TODAY'S TREATMENT:  ***   PATIENT EDUCATION: Education details: *** Person educated: {Person educated:25204} Education method: {Education Method:25205} Education comprehension: {Education Comprehension:25206}   HOME EXERCISE PROGRAM: ***  ASSESSMENT:  CLINICAL IMPRESSION: Patient is a *** y.o. *** who was seen today for physical therapy evaluation and treatment for ***. Objective impairments include {opptimpairments:25111}. These impairments are limiting patient from {activity limitations:25113}. Personal factors including {Personal factors:25162} are also affecting patient's functional outcome. Patient will benefit from skilled PT to address above impairments and improve overall  function.  REHAB POTENTIAL: {rehabpotential:25112}  CLINICAL DECISION MAKING: {clinical decision making:25114}  EVALUATION COMPLEXITY: {Evaluation complexity:25115}   GOALS: Goals reviewed with patient? {yes/no:20286}  SHORT TERM GOALS:  STG Name Target Date Goal status  1 *** Baseline:  {follow up:25551} {GOALSTATUS:25110}  2 *** Baseline:  {follow up:25551} {GOALSTATUS:25110}  3 *** Baseline: {follow up:25551} {GOALSTATUS:25110}  4 *** Baseline: {follow up:25551} {GOALSTATUS:25110}  5 *** Baseline: {follow up:25551} {GOALSTATUS:25110}  6 *** Baseline: {follow up:25551} {GOALSTATUS:25110}  7 *** Baseline: {follow up:25551} {GOALSTATUS:25110}   LONG TERM GOALS:   LTG Name Target Date Goal status  1 *** Baseline: {follow up:25551} {GOALSTATUS:25110}  2 *** Baseline: {follow up:25551} {GOALSTATUS:25110}  3 *** Baseline: {follow up:25551} {GOALSTATUS:25110}  4 *** Baseline: {follow up:25551} {GOALSTATUS:25110}  5 *** Baseline: {follow up:25551} {GOALSTATUS:25110}  6 *** Baseline: {follow up:25551} {GOALSTATUS:25110}  7 *** Baseline: {follow up:25551} {GOALSTATUS:25110}   PLAN: PT FREQUENCY: {rehab frequency:25116}  PT DURATION: {rehab duration:25117}  PLANNED INTERVENTIONS: {rehab planned interventions:25118::"Therapeutic exercises","Therapeutic activity","Neuro Muscular re-education","Balance training","Gait training","Patient/Family education","Joint mobilization"}  PLAN FOR NEXT SESSION: Carmelina Dane, PT, DPT 04/12/21 1:09 PM

## 2021-04-13 ENCOUNTER — Ambulatory Visit: Payer: Medicaid Other | Attending: Orthopaedic Surgery
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
## 2024-01-30 ENCOUNTER — Inpatient Hospital Stay (HOSPITAL_COMMUNITY)
Admission: EM | Admit: 2024-01-30 | Discharge: 2024-02-05 | DRG: 981 | Disposition: A | Discharge status: Home / Self Care | Attending: General Surgery | Admitting: General Surgery

## 2024-01-30 ENCOUNTER — Other Ambulatory Visit: Payer: Self-pay

## 2024-01-30 ENCOUNTER — Inpatient Hospital Stay (HOSPITAL_COMMUNITY): Admitting: Anesthesiology

## 2024-01-30 ENCOUNTER — Encounter (HOSPITAL_COMMUNITY): Admission: EM | Disposition: A | Discharge status: Home / Self Care | Payer: Self-pay | Source: Home / Self Care

## 2024-01-30 ENCOUNTER — Inpatient Hospital Stay (HOSPITAL_COMMUNITY)

## 2024-01-30 ENCOUNTER — Emergency Department (HOSPITAL_COMMUNITY): Payer: Self-pay

## 2024-01-30 ENCOUNTER — Emergency Department (HOSPITAL_COMMUNITY)

## 2024-01-30 DIAGNOSIS — J942 Hemothorax: Principal | ICD-10-CM

## 2024-01-30 DIAGNOSIS — S27331A Laceration of lung, unilateral, initial encounter: Secondary | ICD-10-CM | POA: Diagnosis not present

## 2024-01-30 DIAGNOSIS — Y249XXA Unspecified firearm discharge, undetermined intent, initial encounter: Secondary | ICD-10-CM

## 2024-01-30 DIAGNOSIS — T796XXA Traumatic ischemia of muscle, initial encounter: Secondary | ICD-10-CM | POA: Diagnosis present

## 2024-01-30 DIAGNOSIS — Y906 Blood alcohol level of 120-199 mg/100 ml: Secondary | ICD-10-CM | POA: Diagnosis present

## 2024-01-30 DIAGNOSIS — S2242XA Multiple fractures of ribs, left side, initial encounter for closed fracture: Secondary | ICD-10-CM | POA: Diagnosis present

## 2024-01-30 DIAGNOSIS — S272XXA Traumatic hemopneumothorax, initial encounter: Principal | ICD-10-CM | POA: Diagnosis present

## 2024-01-30 DIAGNOSIS — S27322A Contusion of lung, bilateral, initial encounter: Secondary | ICD-10-CM | POA: Diagnosis present

## 2024-01-30 DIAGNOSIS — F1024 Alcohol dependence with alcohol-induced mood disorder: Secondary | ICD-10-CM | POA: Diagnosis present

## 2024-01-30 DIAGNOSIS — S2193XA Puncture wound without foreign body of unspecified part of thorax, initial encounter: Secondary | ICD-10-CM | POA: Diagnosis not present

## 2024-01-30 DIAGNOSIS — Y846 Urinary catheterization as the cause of abnormal reaction of the patient, or of later complication, without mention of misadventure at the time of the procedure: Secondary | ICD-10-CM | POA: Diagnosis not present

## 2024-01-30 DIAGNOSIS — E876 Hypokalemia: Secondary | ICD-10-CM | POA: Diagnosis present

## 2024-01-30 DIAGNOSIS — D696 Thrombocytopenia, unspecified: Secondary | ICD-10-CM | POA: Diagnosis not present

## 2024-01-30 DIAGNOSIS — D62 Acute posthemorrhagic anemia: Secondary | ICD-10-CM | POA: Diagnosis present

## 2024-01-30 DIAGNOSIS — J9601 Acute respiratory failure with hypoxia: Secondary | ICD-10-CM | POA: Diagnosis present

## 2024-01-30 DIAGNOSIS — R319 Hematuria, unspecified: Secondary | ICD-10-CM | POA: Diagnosis not present

## 2024-01-30 DIAGNOSIS — J939 Pneumothorax, unspecified: Secondary | ICD-10-CM | POA: Diagnosis not present

## 2024-01-30 DIAGNOSIS — F430 Acute stress reaction: Secondary | ICD-10-CM | POA: Diagnosis not present

## 2024-01-30 DIAGNOSIS — S21132A Puncture wound without foreign body of left front wall of thorax without penetration into thoracic cavity, initial encounter: Secondary | ICD-10-CM | POA: Diagnosis present

## 2024-01-30 DIAGNOSIS — Z9911 Dependence on respirator [ventilator] status: Secondary | ICD-10-CM | POA: Diagnosis not present

## 2024-01-30 DIAGNOSIS — R578 Other shock: Secondary | ICD-10-CM | POA: Diagnosis present

## 2024-01-30 DIAGNOSIS — N99820 Postprocedural hemorrhage and hematoma of a genitourinary system organ or structure following a genitourinary system procedure: Secondary | ICD-10-CM | POA: Diagnosis not present

## 2024-01-30 DIAGNOSIS — S2221XA Fracture of manubrium, initial encounter for closed fracture: Secondary | ICD-10-CM | POA: Diagnosis present

## 2024-01-30 HISTORY — PX: VIDEO ASSISTED THORACOSCOPY (VATS)/ LOBECTOMY: SHX6169

## 2024-01-30 LAB — URINALYSIS, ROUTINE W REFLEX MICROSCOPIC
Bilirubin Urine: NEGATIVE
Glucose, UA: 50 mg/dL — AB
Ketones, ur: NEGATIVE mg/dL
Leukocytes,Ua: NEGATIVE
Nitrite: NEGATIVE
Protein, ur: 100 mg/dL — AB
Specific Gravity, Urine: 1.041 — ABNORMAL HIGH (ref 1.005–1.030)
pH: 6 (ref 5.0–8.0)

## 2024-01-30 LAB — PREPARE RBC (CROSSMATCH)

## 2024-01-30 LAB — CBC
HCT: 34.1 % — ABNORMAL LOW (ref 39.0–52.0)
HCT: 33.7 % — ABNORMAL LOW (ref 39.0–52.0)
HCT: 32.5 % — ABNORMAL LOW (ref 39.0–52.0)
Hemoglobin: 11.1 g/dL — ABNORMAL LOW (ref 13.0–17.0)
Hemoglobin: 11.7 g/dL — ABNORMAL LOW (ref 13.0–17.0)
Hemoglobin: 11.5 g/dL — ABNORMAL LOW (ref 13.0–17.0)
MCH: 34 pg (ref 26.0–34.0)
MCH: 32.8 pg (ref 26.0–34.0)
MCH: 31.9 pg (ref 26.0–34.0)
MCHC: 32.6 g/dL (ref 30.0–36.0)
MCHC: 34.7 g/dL (ref 30.0–36.0)
MCHC: 35.4 g/dL (ref 30.0–36.0)
MCV: 104.6 fL — ABNORMAL HIGH (ref 80.0–100.0)
MCV: 94.4 fL (ref 80.0–100.0)
MCV: 90 fL (ref 80.0–100.0)
Platelets: 218 K/uL (ref 150–400)
Platelets: 132 K/uL — ABNORMAL LOW (ref 150–400)
Platelets: UNDETERMINED K/uL (ref 150–400)
RBC: 3.26 MIL/uL — ABNORMAL LOW (ref 4.22–5.81)
RBC: 3.57 MIL/uL — ABNORMAL LOW (ref 4.22–5.81)
RBC: 3.61 MIL/uL — ABNORMAL LOW (ref 4.22–5.81)
RDW: 12.2 % (ref 11.5–15.5)
RDW: 17.1 % — ABNORMAL HIGH (ref 11.5–15.5)
RDW: 18.1 % — ABNORMAL HIGH (ref 11.5–15.5)
WBC: 9.1 K/uL (ref 4.0–10.5)
WBC: 15.3 K/uL — ABNORMAL HIGH (ref 4.0–10.5)
WBC: 16.2 K/uL — ABNORMAL HIGH (ref 4.0–10.5)
nRBC: 0 % (ref 0.0–0.2)
nRBC: 0.1 % (ref 0.0–0.2)
nRBC: 0 % (ref 0.0–0.2)

## 2024-01-30 LAB — POCT I-STAT 7, (LYTES, BLD GAS, ICA,H+H)
Acid-base deficit: 4 mmol/L — ABNORMAL HIGH (ref 0.0–2.0)
Bicarbonate: 21.6 mmol/L (ref 20.0–28.0)
Calcium, Ion: 1.12 mmol/L — ABNORMAL LOW (ref 1.15–1.40)
HCT: 29 % — ABNORMAL LOW (ref 39.0–52.0)
Hemoglobin: 9.9 g/dL — ABNORMAL LOW (ref 13.0–17.0)
O2 Saturation: 100 %
Patient temperature: 36.5
Potassium: 4.2 mmol/L (ref 3.5–5.1)
Sodium: 142 mmol/L (ref 135–145)
TCO2: 23 mmol/L (ref 22–32)
pCO2 arterial: 41.2 mmHg (ref 32–48)
pH, Arterial: 7.326 — ABNORMAL LOW (ref 7.35–7.45)
pO2, Arterial: 249 mmHg — ABNORMAL HIGH (ref 83–108)

## 2024-01-30 LAB — BASIC METABOLIC PANEL WITH GFR
Anion gap: 9 (ref 5–15)
BUN: 8 mg/dL (ref 6–20)
CO2: 21 mmol/L — ABNORMAL LOW (ref 22–32)
Calcium: 7.1 mg/dL — ABNORMAL LOW (ref 8.9–10.3)
Chloride: 110 mmol/L (ref 98–111)
Creatinine, Ser: 0.89 mg/dL (ref 0.61–1.24)
GFR, Estimated: >60 mL/min (ref >60)
Glucose, Bld: 131 mg/dL — ABNORMAL HIGH (ref 70–99)
Potassium: 4.2 mmol/L (ref 3.5–5.1)
Sodium: 140 mmol/L (ref 135–145)

## 2024-01-30 LAB — PREPARE PLATELET PHERESIS: Unit division: 0

## 2024-01-30 LAB — I-STAT CG4 LACTIC ACID, ED: Lactic Acid, Venous: 7.1 mmol/L (ref 0.5–1.9)

## 2024-01-30 LAB — I-STAT ARTERIAL BLOOD GAS, ED
Acid-base deficit: 6 mmol/L — ABNORMAL HIGH (ref 0.0–2.0)
Bicarbonate: 19.5 mmol/L — ABNORMAL LOW (ref 20.0–28.0)
Calcium, Ion: 1 mmol/L — ABNORMAL LOW (ref 1.15–1.40)
HCT: 25 % — ABNORMAL LOW (ref 39.0–52.0)
Hemoglobin: 8.5 g/dL — ABNORMAL LOW (ref 13.0–17.0)
O2 Saturation: 100 %
Patient temperature: 95.8
Potassium: 3.3 mmol/L — ABNORMAL LOW (ref 3.5–5.1)
Sodium: 141 mmol/L (ref 135–145)
TCO2: 21 mmol/L — ABNORMAL LOW (ref 22–32)
pCO2 arterial: 33.5 mmHg (ref 32–48)
pH, Arterial: 7.366 (ref 7.35–7.45)
pO2, Arterial: 167 mmHg — ABNORMAL HIGH (ref 83–108)

## 2024-01-30 LAB — LACTIC ACID, PLASMA: Lactic Acid, Venous: 1 mmol/L (ref 0.5–1.9)

## 2024-01-30 LAB — PREPARE CRYOPRECIPITATE: Unit division: 0

## 2024-01-30 LAB — COMPREHENSIVE METABOLIC PANEL WITH GFR
ALT: 19 U/L (ref 0–44)
AST: 37 U/L (ref 15–41)
Albumin: 3.5 g/dL (ref 3.5–5.0)
Alkaline Phosphatase: 40 U/L (ref 38–126)
Anion gap: 17 — ABNORMAL HIGH (ref 5–15)
BUN: 9 mg/dL (ref 6–20)
CO2: 18 mmol/L — ABNORMAL LOW (ref 22–32)
Calcium: 8 mg/dL — ABNORMAL LOW (ref 8.9–10.3)
Chloride: 108 mmol/L (ref 98–111)
Creatinine, Ser: 1.29 mg/dL — ABNORMAL HIGH (ref 0.61–1.24)
GFR, Estimated: >60 mL/min (ref >60)
Glucose, Bld: 174 mg/dL — ABNORMAL HIGH (ref 70–99)
Potassium: 3 mmol/L — ABNORMAL LOW (ref 3.5–5.1)
Sodium: 143 mmol/L (ref 135–145)
Total Bilirubin: 0.8 mg/dL (ref 0.0–1.2)
Total Protein: 6.6 g/dL (ref 6.5–8.1)

## 2024-01-30 LAB — I-STAT CHEM 8, ED
BUN: 8 mg/dL (ref 6–20)
Calcium, Ion: 1.04 mmol/L — ABNORMAL LOW (ref 1.15–1.40)
Chloride: 107 mmol/L (ref 98–111)
Creatinine, Ser: 1.4 mg/dL — ABNORMAL HIGH (ref 0.61–1.24)
Glucose, Bld: 166 mg/dL — ABNORMAL HIGH (ref 70–99)
HCT: 35 % — ABNORMAL LOW (ref 39.0–52.0)
Hemoglobin: 11.9 g/dL — ABNORMAL LOW (ref 13.0–17.0)
Potassium: 2.9 mmol/L — ABNORMAL LOW (ref 3.5–5.1)
Sodium: 144 mmol/L (ref 135–145)
TCO2: 17 mmol/L — ABNORMAL LOW (ref 22–32)

## 2024-01-30 LAB — ABO/RH: ABO/RH(D): A POS

## 2024-01-30 LAB — PROTIME-INR
INR: 1.1 (ref 0.8–1.2)
Prothrombin Time: 14.8 s (ref 11.4–15.2)

## 2024-01-30 LAB — HIV ANTIBODY (ROUTINE TESTING W REFLEX): HIV Screen 4th Generation wRfx: NONREACTIVE

## 2024-01-30 LAB — BPAM PLATELET PHERESIS
Blood Product Expiration Date: 202511012359
ISSUE DATE / TIME: 202510301721
Unit Type and Rh: 5100

## 2024-01-30 LAB — MASSIVE TRANSFUSION PROTOCOL ORDER (BLOOD BANK NOTIFICATION)

## 2024-01-30 LAB — BPAM CRYOPRECIPITATE
Blood Product Expiration Date: 202511032359
ISSUE DATE / TIME: 202510301721
Unit Type and Rh: 5100

## 2024-01-30 LAB — CK: Total CK: 1029 U/L — ABNORMAL HIGH (ref 49–397)

## 2024-01-30 LAB — ETHANOL: Alcohol, Ethyl (B): 196 mg/dL — ABNORMAL HIGH (ref <15)

## 2024-01-30 LAB — MRSA NEXT GEN BY PCR, NASAL: MRSA by PCR Next Gen: NOT DETECTED

## 2024-01-30 SURGERY — VIDEO ASSISTED THORACOSCOPY (VATS)/ LOBECTOMY
Anesthesia: General | Site: Chest | Laterality: Left

## 2024-01-30 MED ORDER — POLYETHYLENE GLYCOL 3350 17 G PO PACK
17.0000 g | PACK | Freq: Every day | ORAL | Status: DC | PRN
Start: 2024-01-30 — End: 2024-02-01
  Filled 2024-01-30: qty 1

## 2024-01-30 MED ORDER — IOHEXOL 350 MG/ML SOLN
100.0000 mL | Freq: Once | INTRAVENOUS | Status: AC | PRN
Start: 2024-01-30 — End: 2024-01-30
  Administered 2024-01-30 (×2): 100 mL via INTRAVENOUS

## 2024-01-30 MED ORDER — LACTATED RINGERS IV SOLN
INTRAVENOUS | Status: AC
Start: 2024-01-30 — End: 2024-01-31

## 2024-01-30 MED ORDER — HEMOSTATIC AGENTS (NO CHARGE) OPTIME
TOPICAL | Status: DC | PRN
Start: 2024-01-30 — End: 2024-01-30
  Administered 2024-01-30 (×4): 1 via TOPICAL

## 2024-01-30 MED ORDER — SODIUM CHLORIDE 0.9 % IV BOLUS
1000.0000 mL | Freq: Once | INTRAVENOUS | Status: AC
Start: 2024-01-30 — End: ?

## 2024-01-30 MED ORDER — ONDANSETRON HCL 4 MG PO TABS: 4.0000 mg | ORAL_TABLET | Freq: Four times a day (QID) | ORAL | Status: DC | PRN

## 2024-01-30 MED ORDER — PANTOPRAZOLE SODIUM 40 MG IV SOLR: 40.0000 mg | Freq: Every day | INTRAVENOUS | Status: DC

## 2024-01-30 MED ORDER — FENTANYL BOLUS VIA INFUSION
25.0000 ug | INTRAVENOUS | Status: DC | PRN
  Administered 2024-01-31 (×2): 100 ug via INTRAVENOUS

## 2024-01-30 MED ORDER — HYDROMORPHONE HCL 1 MG/ML IJ SOLN
INTRAMUSCULAR | Status: DC | PRN
  Administered 2024-01-30 (×4): .5 mg via INTRAVENOUS

## 2024-01-30 MED ORDER — FENTANYL CITRATE (PF) 50 MCG/ML IJ SOSY
25.0000 ug | PREFILLED_SYRINGE | Freq: Once | INTRAMUSCULAR | Status: AC
  Administered 2024-01-30 (×2): 50 ug via INTRAVENOUS

## 2024-01-30 MED ORDER — FENTANYL 2500MCG IN NS 250ML (10MCG/ML) PREMIX INFUSION
0.0000 ug/h | INTRAVENOUS | Status: DC
  Administered 2024-01-30: 25 ug/h via INTRAVENOUS
  Administered 2024-01-30 (×2): 250 ug/h via INTRAVENOUS
  Administered 2024-01-30 – 2024-01-31 (×3): 25 ug/h via INTRAVENOUS
  Filled 2024-01-30 (×2): qty 250

## 2024-01-30 MED ORDER — METHOCARBAMOL 500 MG PO TABS
500.0000 mg | ORAL_TABLET | Freq: Three times a day (TID) | ORAL | Status: DC
  Administered 2024-01-31 (×2): 500 mg
  Filled 2024-01-30: qty 1

## 2024-01-30 MED ORDER — SODIUM CHLORIDE 0.9% IV SOLUTION: Freq: Once | INTRAVENOUS | Status: DC

## 2024-01-30 MED ORDER — CHLORHEXIDINE GLUCONATE CLOTH 2 % EX PADS
6.0000 | MEDICATED_PAD | Freq: Every day | CUTANEOUS | Status: DC
  Administered 2024-01-31 – 2024-02-02 (×6): 6 via TOPICAL

## 2024-01-30 MED ORDER — ONDANSETRON HCL 4 MG/2ML IJ SOLN: 4.0000 mg | Freq: Four times a day (QID) | INTRAMUSCULAR | Status: DC | PRN

## 2024-01-30 MED ORDER — CHLORHEXIDINE GLUCONATE CLOTH 2 % EX PADS
6.0000 | MEDICATED_PAD | Freq: Every day | CUTANEOUS | Status: DC
  Administered 2024-01-31 (×2): 6 via TOPICAL

## 2024-01-30 MED ORDER — METHOCARBAMOL 1000 MG/10ML IJ SOLN: 500.0000 mg | Freq: Three times a day (TID) | INTRAMUSCULAR | Status: DC

## 2024-01-30 MED ORDER — HYDROMORPHONE HCL 1 MG/ML IJ SOLN
INTRAMUSCULAR | Status: AC
  Filled 2024-01-30: qty 0.5

## 2024-01-30 MED ORDER — ROCURONIUM BROMIDE 10 MG/ML (PF) SYRINGE
PREFILLED_SYRINGE | INTRAVENOUS | Status: AC
  Filled 2024-01-30: qty 30

## 2024-01-30 MED ORDER — ROCURONIUM BROMIDE 10 MG/ML (PF) SYRINGE
PREFILLED_SYRINGE | INTRAVENOUS | Status: AC | PRN
  Administered 2024-01-30 (×2): 80 mg via INTRAVENOUS

## 2024-01-30 MED ORDER — PANTOPRAZOLE SODIUM 40 MG PO TBEC
40.0000 mg | DELAYED_RELEASE_TABLET | Freq: Every day | ORAL | Status: DC
  Administered 2024-01-31 – 2024-02-05 (×12): 40 mg via ORAL
  Filled 2024-01-30 (×6): qty 1

## 2024-01-30 MED ORDER — DEXAMETHASONE SOD PHOSPHATE PF 10 MG/ML IJ SOLN
INTRAMUSCULAR | Status: DC | PRN
  Administered 2024-01-30 (×2): 10 mg via INTRAVENOUS

## 2024-01-30 MED ORDER — SODIUM CHLORIDE 0.9% FLUSH: 10.0000 mL | INTRAVENOUS | Status: DC | PRN

## 2024-01-30 MED ORDER — FENTANYL CITRATE (PF) 50 MCG/ML IJ SOSY
PREFILLED_SYRINGE | INTRAMUSCULAR | Status: AC
  Filled 2024-01-30: qty 1

## 2024-01-30 MED ORDER — METOPROLOL TARTRATE 5 MG/5ML IV SOLN: 5.0000 mg | Freq: Four times a day (QID) | INTRAVENOUS | Status: DC | PRN

## 2024-01-30 MED ORDER — ALBUMIN HUMAN 5 % IV SOLN: INTRAVENOUS | Status: DC | PRN

## 2024-01-30 MED ORDER — NOREPINEPHRINE 4 MG/250ML-% IV SOLN
0.0000 ug/min | INTRAVENOUS | Status: DC
  Administered 2024-01-30 (×2): 4 ug/min via INTRAVENOUS

## 2024-01-30 MED ORDER — CEFAZOLIN SODIUM 1 G IJ SOLR
INTRAMUSCULAR | Status: AC
  Filled 2024-01-30: qty 20

## 2024-01-30 MED ORDER — ETOMIDATE 2 MG/ML IV SOLN
INTRAVENOUS | Status: AC | PRN
  Administered 2024-01-30 (×2): 20 mg via INTRAVENOUS

## 2024-01-30 MED ORDER — SODIUM CHLORIDE 0.9 % IV SOLN
INTRAVENOUS | Status: AC | PRN
  Administered 2024-01-30 (×2): 1000 mL via INTRAVENOUS

## 2024-01-30 MED ORDER — HYDRALAZINE HCL 20 MG/ML IJ SOLN: 10.0000 mg | INTRAMUSCULAR | Status: DC | PRN

## 2024-01-30 MED ORDER — CALCIUM GLUCONATE-NACL 2-0.675 GM/100ML-% IV SOLN
2.0000 g | Freq: Once | INTRAVENOUS | Status: AC
  Administered 2024-01-30 (×2): 2000 mg via INTRAVENOUS
  Filled 2024-01-30: qty 100

## 2024-01-30 MED ORDER — QUETIAPINE FUMARATE 25 MG PO TABS
50.0000 mg | ORAL_TABLET | Freq: Two times a day (BID) | ORAL | Status: DC
  Administered 2024-01-30 (×2): 50 mg
  Filled 2024-01-30: qty 2

## 2024-01-30 MED ORDER — FENTANYL 2500MCG IN NS 250ML (10MCG/ML) PREMIX INFUSION
0.0000 ug/h | INTRAVENOUS | Status: DC
  Administered 2024-01-30 (×2): 50 ug/h via INTRAVENOUS
  Filled 2024-01-30: qty 250

## 2024-01-30 MED ORDER — 0.9 % SODIUM CHLORIDE (POUR BTL) OPTIME
TOPICAL | Status: DC | PRN
  Administered 2024-01-30 (×2): 2000 mL

## 2024-01-30 MED ORDER — CEFAZOLIN SODIUM-DEXTROSE 2-3 GM-%(50ML) IV SOLR
INTRAVENOUS | Status: DC | PRN
  Administered 2024-01-30 (×2): 2 g via INTRAVENOUS

## 2024-01-30 MED ORDER — SODIUM CHLORIDE 0.9 % IV SOLN: INTRAVENOUS | Status: AC | PRN

## 2024-01-30 MED ORDER — ACETAMINOPHEN 500 MG PO TABS
1000.0000 mg | ORAL_TABLET | Freq: Four times a day (QID) | ORAL | Status: DC
  Administered 2024-01-30 – 2024-01-31 (×4): 1000 mg
  Filled 2024-01-30 (×2): qty 2

## 2024-01-30 MED ORDER — DEXMEDETOMIDINE HCL IN NACL 400 MCG/100ML IV SOLN
0.0000 ug/kg/h | INTRAVENOUS | Status: DC
  Administered 2024-01-30 (×2): 0.5 ug/kg/h via INTRAVENOUS
  Administered 2024-01-31: 1 ug/kg/h via INTRAVENOUS
  Administered 2024-01-31: 1.1 ug/kg/h via INTRAVENOUS
  Administered 2024-01-31: 1 ug/kg/h via INTRAVENOUS
  Administered 2024-01-31: 0.7 ug/kg/h via INTRAVENOUS
  Administered 2024-01-31 (×2): 1 ug/kg/h via INTRAVENOUS
  Administered 2024-01-31: 1.1 ug/kg/h via INTRAVENOUS
  Administered 2024-01-31 (×2): 1 ug/kg/h via INTRAVENOUS
  Administered 2024-01-31: 0.7 ug/kg/h via INTRAVENOUS
  Administered 2024-02-01 (×2): 0.4 ug/kg/h via INTRAVENOUS
  Filled 2024-01-30 (×8): qty 100

## 2024-01-30 MED ORDER — ROCURONIUM BROMIDE 10 MG/ML (PF) SYRINGE
PREFILLED_SYRINGE | INTRAVENOUS | Status: DC | PRN
  Administered 2024-01-30: 50 mg via INTRAVENOUS
  Administered 2024-01-30: 100 mg via INTRAVENOUS
  Administered 2024-01-30: 50 mg via INTRAVENOUS
  Administered 2024-01-30: 100 mg via INTRAVENOUS

## 2024-01-30 MED ORDER — SODIUM CHLORIDE 0.9% IV SOLUTION: Freq: Once | INTRAVENOUS | Status: AC

## 2024-01-30 MED ORDER — SODIUM CHLORIDE 0.9% FLUSH
10.0000 mL | Freq: Two times a day (BID) | INTRAVENOUS | Status: DC
  Administered 2024-01-30 (×2): 10 mL
  Administered 2024-01-31: 40 mL
  Administered 2024-01-31 (×2): 10 mL
  Administered 2024-01-31: 40 mL

## 2024-01-30 MED ORDER — POTASSIUM CHLORIDE 20 MEQ PO PACK
40.0000 meq | PACK | ORAL | Status: AC
  Administered 2024-01-30 (×4): 40 meq
  Filled 2024-01-30 (×2): qty 2

## 2024-01-30 MED ORDER — FENTANYL CITRATE (PF) 50 MCG/ML IJ SOSY: 25.0000 ug | PREFILLED_SYRINGE | Freq: Once | INTRAMUSCULAR | Status: DC

## 2024-01-30 MED ORDER — NOREPINEPHRINE 4 MG/250ML-% IV SOLN
INTRAVENOUS | Status: AC
  Filled 2024-01-30: qty 250

## 2024-01-30 MED ORDER — PROPOFOL 1000 MG/100ML IV EMUL
0.0000 ug/kg/min | INTRAVENOUS | Status: DC
  Administered 2024-01-30: 55 ug/kg/min via INTRAVENOUS
  Administered 2024-01-30: 40 ug/kg/min via INTRAVENOUS
  Administered 2024-01-30: 80 ug/kg/min via INTRAVENOUS
  Administered 2024-01-30: 40 ug/kg/min via INTRAVENOUS
  Administered 2024-01-30: 80 ug/kg/min via INTRAVENOUS
  Administered 2024-01-30: 55 ug/kg/min via INTRAVENOUS
  Administered 2024-01-31 (×2): 20 ug/kg/min via INTRAVENOUS
  Filled 2024-01-30 (×4): qty 100

## 2024-01-30 MED ORDER — POTASSIUM CHLORIDE 20 MEQ PO PACK
40.0000 meq | PACK | Freq: Once | ORAL | Status: AC
  Administered 2024-01-31 (×2): 40 meq
  Filled 2024-01-30 (×2): qty 2

## 2024-01-30 SURGICAL SUPPLY — 82 items
BLADE CLIPPER SURG (BLADE) ×1 IMPLANT
CANISTER SUCTION 3000ML PPV (SUCTIONS) ×1 IMPLANT
CATH THORACIC 28FR (CATHETERS) IMPLANT
CATH THORACIC 28FR RT ANG (CATHETERS) IMPLANT
CATH THORACIC 36FR (CATHETERS) IMPLANT
CATH THORACIC 36FR RT ANG (CATHETERS) IMPLANT
CATH TROCAR 20FR (CATHETERS) IMPLANT
CHLORAPREP W/TINT 26 (MISCELLANEOUS) ×1 IMPLANT
CLIP APPLIE ROT 10 11.4 M/L (STAPLE) IMPLANT
CLIP TI MEDIUM 6 (CLIP) ×1 IMPLANT
CNTNR URN SCR LID CUP LEK RST (MISCELLANEOUS) ×2 IMPLANT
CONN ST 1/4X3/8 BEN (MISCELLANEOUS) IMPLANT
CONN Y 3/8X3/8X3/8 BEN (MISCELLANEOUS) IMPLANT
COVER SURGICAL LIGHT HANDLE (MISCELLANEOUS) IMPLANT
DEFOGGER SCOPE WARM SEASHARP (MISCELLANEOUS) ×1 IMPLANT
DISSECTOR BLUNT TIP ENDO 5MM (MISCELLANEOUS) IMPLANT
DRAIN CHANNEL 28F RND 3/8 FF (WOUND CARE) IMPLANT
DRAPE WARM FLUID 44X44 (DRAPES) ×1 IMPLANT
ELECT BLADE 6.5 EXT (BLADE) ×1 IMPLANT
ELECTRODE REM PT RTRN 9FT ADLT (ELECTROSURGICAL) ×1 IMPLANT
GAUZE 4X4 16PLY ~~LOC~~+RFID DBL (SPONGE) ×1 IMPLANT
GAUZE SPONGE 4X4 12PLY STRL (GAUZE/BANDAGES/DRESSINGS) ×1 IMPLANT
GLOVE BIO SURGEON STRL SZ7 (GLOVE) ×2 IMPLANT
GOWN STRL REUS W/ TWL LRG LVL3 (GOWN DISPOSABLE) ×2 IMPLANT
GOWN STRL REUS W/ TWL XL LVL3 (GOWN DISPOSABLE) ×1 IMPLANT
GOWN STRL SURGICAL XL XLNG (GOWN DISPOSABLE) ×1 IMPLANT
HEMOSTAT SURGICEL 2X14 (HEMOSTASIS) IMPLANT
IRRIGATION SUCT STRKRFLW 2 WTP (MISCELLANEOUS) IMPLANT
KIT BASIN OR (CUSTOM PROCEDURE TRAY) ×1 IMPLANT
KIT SUCTION CATH 14FR (SUCTIONS) IMPLANT
KIT TURNOVER KIT B (KITS) ×1 IMPLANT
NDL 22X1.5 STRL (OR ONLY) (MISCELLANEOUS) ×1 IMPLANT
NDL SPNL 18GX3.5 QUINCKE PK (NEEDLE) IMPLANT
NEEDLE 22X1.5 STRL (OR ONLY) (MISCELLANEOUS) ×1 IMPLANT
NEEDLE SPNL 18GX3.5 QUINCKE PK (NEEDLE) IMPLANT
PACK CHEST (CUSTOM PROCEDURE TRAY) ×1 IMPLANT
PACK UNIVERSAL I (CUSTOM PROCEDURE TRAY) ×1 IMPLANT
PAD ARMBOARD POSITIONER FOAM (MISCELLANEOUS) ×2 IMPLANT
POUCH ENDO CATCH II 15MM (MISCELLANEOUS) IMPLANT
POWDER SURGICEL 3.0 GRAM (HEMOSTASIS) IMPLANT
RETRACTOR WOUND ALXS 19CM XSML (INSTRUMENTS) IMPLANT
SCISSORS LAP 5X35 DISP (ENDOMECHANICALS) IMPLANT
SEALANT PROGEL (MISCELLANEOUS) IMPLANT
SEALANT SURG COSEAL 4ML (VASCULAR PRODUCTS) IMPLANT
SEALANT SURG COSEAL 8ML (VASCULAR PRODUCTS) IMPLANT
SEALER LIGASURE MARYLAND 30 (ELECTROSURGICAL) ×1 IMPLANT
SET IV EXT TUBING 30 (IV SETS) ×1 IMPLANT
SOLN 0.9% NACL POUR BTL 1000ML (IV SOLUTION) ×3 IMPLANT
SOLN STERILE WATER BTL 1000 ML (IV SOLUTION) ×1 IMPLANT
SPECIMEN JAR MEDIUM (MISCELLANEOUS) IMPLANT
SPONGE INTESTINAL PEANUT (DISPOSABLE) ×2 IMPLANT
SPONGE T-LAP 18X18 ~~LOC~~+RFID (SPONGE) ×4 IMPLANT
SPONGE TONSIL 1 RF SGL (DISPOSABLE) ×1 IMPLANT
STAPLER ENDO GIA 12 SHRT THIN (STAPLE) ×1 IMPLANT
STAPLER SKIN PROX 35W (STAPLE) IMPLANT
STOPCOCK 4 WAY LG BORE MALE ST (IV SETS) ×1 IMPLANT
SUT MNCRL AB 3-0 PS2 18 (SUTURE) IMPLANT
SUT MON AB 2-0 CT1 36 (SUTURE) IMPLANT
SUT PDS AB 1 CTX 36 (SUTURE) IMPLANT
SUT PROLENE 4-0 RB1 .5 CRCL 36 (SUTURE) IMPLANT
SUT SILK 1 MH (SUTURE) IMPLANT
SUT SILK 1 TIES 10X30 (SUTURE) ×1 IMPLANT
SUT SILK 2 0 SH (SUTURE) IMPLANT
SUT SILK 2 0SH CR/8 30 (SUTURE) IMPLANT
SUT VIC AB 1 CTX36XBRD ANBCTR (SUTURE) IMPLANT
SUT VIC AB 2-0 CT1 TAPERPNT 27 (SUTURE) ×2 IMPLANT
SUT VIC AB 2-0 UR6 27 (SUTURE) IMPLANT
SUT VIC AB 3-0 SH 27X BRD (SUTURE) ×1 IMPLANT
SUT VIC AB 3-0 X1 27 (SUTURE) IMPLANT
SUT VICRYL 0 UR6 27IN ABS (SUTURE) ×1 IMPLANT
SYR 10ML LL (SYRINGE) ×1 IMPLANT
SYR 30ML LL (SYRINGE) ×1 IMPLANT
SYR 50ML LL SCALE MARK (SYRINGE) ×1 IMPLANT
SYSTEM BAG RETRIEVAL 10MM (BASKET) IMPLANT
SYSTEM SAHARA CHEST DRAIN ATS (WOUND CARE) ×1 IMPLANT
TAPE CLOTH 4X10 WHT NS (GAUZE/BANDAGES/DRESSINGS) ×1 IMPLANT
TAPE CLOTH SURG 4X10 WHT LF (GAUZE/BANDAGES/DRESSINGS) IMPLANT
TOWEL GREEN STERILE (TOWEL DISPOSABLE) ×1 IMPLANT
TOWEL GREEN STERILE FF (TOWEL DISPOSABLE) ×1 IMPLANT
TRAY FOLEY MTR SLVR 16FR STAT (SET/KITS/TRAYS/PACK) ×1 IMPLANT
TROCAR XCEL BLADELESS 5X75MML (TROCAR) ×1 IMPLANT
TROCAR XCEL NON-BLD 5MMX100MML (ENDOMECHANICALS) IMPLANT
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
## 2024-01-30 NOTE — Progress Notes (Addendum)
 Patient ID: Willie Jacobs, male   DOB: 1985/05/04, 38 y.o.   MRN: [id number] He became hypotensive and has put 600cc into new pleurevac so far. I placed a central line and we are giving more blood products. I have updated Dr. Shyrl. Appreciate his help.  Additional critical care for hemorrhagic shock.  Dann Hummer, MD, MPH, FACS Please use [email or web address] to contact on call provider

## 2024-01-30 NOTE — Transfer of Care (Signed)
 Immediate Anesthesia Transfer of Care Note  Patient: Willie Jacobs  Procedure(s) Performed: LEFT VIDEO ASSISTED THORACOSCOPY (VATS) WASHOUT (Left: Chest)  Patient Location: SICU  Anesthesia Type:General  Level of Consciousness: sedated and Patient remains intubated per anesthesia plan  Airway & Oxygen Therapy: Patient remains intubated per anesthesia plan and Patient placed on Ventilator (see vital sign flow sheet for setting)  Post-op Assessment: Report given to RN and Post -op Vital signs reviewed and stable  Post vital signs: Reviewed and stable  Last Vitals:  Vitals Value Taken Time  BP    Temp    Pulse    Resp    SpO2      Last Pain:  Vitals:   01/30/24 1545  TempSrc: Bladder  PainSc:          Complications: No notable events documented.

## 2024-01-30 NOTE — TOC CAGE-AID Note (Signed)
 Transition of Care [facility]) - CAGE-AID Screening   Patient Details  Name: Willie Jacobs MRN: [id number] Date of Birth: [birth year]  Transition of Care [facility]) CM/SW Contact:    Holbert Caples E Makaylee Spielberg, LCSW Phone Number: 01/30/2024, 2:51 PM   Clinical Narrative:    CAGE-AID Screening: Substance Abuse Screening unable to be completed due to: : Patient unable to participate

## 2024-01-30 NOTE — ED Notes (Signed)
 Trauma Response Nurse Documentation   Willie Jacobs is a 37 y.o. male arriving to [facility] ED via {Trauma ED/EMS:26864}  On {meds; anticoagulants:31417}. Trauma was activated as a {Trauma Level:26868} by *** based on the following trauma criteria {Trauma criteria:26865}.  Patient cleared for CT by Dr. FERNAND Pt transported to {TRN Radiology:26861::CT} with trauma response nurse present to monitor. RN remained with the patient throughout their absence from the department for clinical observation.   GCS ***.  Trauma MD Arrival Time: ***.  History   No past medical history on file.   *** The histories are not reviewed yet. Please review them in the History navigator section and refresh this SmartLink.     Initial Focused Assessment (If applicable, or please see trauma documentation):   CT's Completed:   {Trauma CT:26866}   Interventions:   Plan for disposition:  {Trauma Dispo:26867}   Consults completed:  {Trauma Consults:26862} at ***.  Event Summary:  MTP Summary (If applicable):   Bedside handoff with {Trauma handoff:26863::ED RN} ***.    Willie Jacobs  Trauma Response RN  Please call TRN at [phone] for further assistance.

## 2024-01-30 NOTE — Significant Event (Signed)
 Rapid Response Event Note   Reason for Call : Hypotension/Pt is losing tons of blood in chest tube and insertion site    Initial Focused Assessment:  A- Pt ventilated . B- diminished Left lung  C- bleeding sites at left chest area and total blood loss of 700 ccs in chest tube, 1 soaked chuck, and 1 bandage  - 1800ccs lost earlier when brought to unit     Interventions:  TRN called MD ordered Emergent release 2 PRBCs and 2 FFPs Femoral central line placed     Plan of Care:  Transfuse and scheduled OR, awaiting OR arrival. Family notified and they will be waiting for OR waiting room on the second floor.     Event Summary:  FFP 1650 PRBC 1705 PRBC 1710 FFP 1712 FFP 1713 END time: 1715   MD Notified: Dr. Sebastian Call Time: 1630 Arrival Time: 1630 End Time: 1715  Jamas Dines, RN

## 2024-01-30 NOTE — Procedures (Signed)
 Central line  Date/Time: 01/30/2024 4:51 PM  Performed by: Sebastian Moles, MD Authorized by: Sebastian Moles, MD   Consent:    Consent obtained:  Emergent situation Pre-procedure details:    Indication(s): central venous access     Hand hygiene: Hand hygiene performed prior to insertion     Sterile barrier technique: All elements of maximal sterile technique followed     Skin preparation:  Chlorhexidine Procedure details:    Location:  R femoral   Procedural supplies:  Triple lumen   Catheter size:  7 Fr   Ultrasound guidance: no     Number of attempts:  1   Successful placement: yes   Post-procedure details:    Post-procedure:  Dressing applied and line sutured   Assessment:  Blood return through all ports   Procedure completion:  Tolerated  Moles Sebastian, MD, MPH, FACS Please use [email or web address] to contact on call provider

## 2024-01-30 NOTE — Progress Notes (Signed)
 Transported patient from operation room to 4North 27 while patient was on the mechanical ventilator. Patient remained stable.

## 2024-01-30 NOTE — ED Notes (Signed)
 CCMD called to report patient transfer to 4N27.

## 2024-01-30 NOTE — Procedures (Signed)
 Arterial Line Insertion Start/End10/30/2025 1:46 PM, 01/30/2024 1:46 PM  Patient location: ICU. Emergency situation Left, radial was placed Catheter size: 20 G Hand hygiene performed , maximum sterile barriers used  and Seldinger technique used Allen's test indicative of satisfactory collateral circulation Attempts: 1 Following insertion, dressing applied and Biopatch. Post procedure assessment: normal  Patient tolerated the procedure well with no immediate complications.

## 2024-01-30 NOTE — ED Notes (Signed)
 L chest tube inserted

## 2024-01-30 NOTE — ED Notes (Signed)
 X-ray at bedside.
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
## 2024-01-30 NOTE — ED Notes (Signed)
 Patient transported to CT

## 2024-01-30 NOTE — Progress Notes (Signed)
 Patient ID: Willie Jacobs, male   DOB: 01-11-1986, 38 y.o.   MRN: [id number] I spoke with his sister and his fiancee and updated them. They asked me to call his mother and I did. I answered her questions as well.  Dann Hummer, MD, MPH, FACS Please use [email or web address] to contact on call provider

## 2024-01-30 NOTE — ED Notes (Signed)
 Pt noted to have bright red colored urine. Dr. Sebastian and Darice, TRN aware.
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
## 2024-01-30 NOTE — Progress Notes (Signed)
 Pharmacy Electrolyte Replacement  Recent Labs:  Recent Labs    01/30/24 1014 01/30/24 1120  K 2.9* 3.3*  CREATININE 1.40*  --     Low Critical Values (K </= 2.5, Phos </= 1, Mg </= 1) Present: None  Plan: Give KCL 40 meq per tube q4h x 2 doses and calcium gluconate 2g IV once per trauma electrolyte protocol.   Elianah Karis, PharmD

## 2024-01-30 NOTE — Progress Notes (Signed)
     [address]       [place] [address]             [phone]       Notified of increase in chest tube output OR for L VATS, washout.  Alaine Loughney MALVA Rayas

## 2024-01-30 NOTE — Progress Notes (Signed)
 Patient ID: Willie Jacobs, male   DOB: [birth year], 38 y.o.   MRN: [id number] I am consulting TCTS in light of chest tube output. Sats improved with suctioning.  Dann Hummer, MD, MPH, FACS Please use [email or web address] to contact on call provider
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
## 2024-01-30 NOTE — Anesthesia Procedure Notes (Signed)
 Procedure Name: Intubation Date/Time: 01/30/2024 6:10 PM  Performed by: Zelphia Norleen HERO, CRNAPre-anesthesia Checklist: Patient identified, Emergency Drugs available, Suction available and Patient being monitored Patient Re-evaluated:Patient Re-evaluated prior to induction Oxygen Delivery Method: Circle system utilized Preoxygenation: Pre-oxygenation with 100% oxygen Induction Type: IV induction Laryngoscope Size: Mac and 4 Grade View: Grade I Tube type: Oral Endobronchial tube: Double lumen EBT, EBT position confirmed by fiberoptic bronchoscope and Left and 39 Fr Number of attempts: 1 Airway Equipment and Method: Stylet and Oral airway Placement Confirmation: ETT inserted through vocal cords under direct vision, positive ETCO2 and breath sounds checked- equal and bilateral Secured at: 27 cm Tube secured with: Tape Dental Injury: Teeth and Oropharynx as per pre-operative assessment  Comments: Cook catheter exchange to remove pre-existing ETT
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
## 2024-01-30 NOTE — Procedures (Signed)
 Chest tube insertion  Date/Time: 01/30/2024 10:54 AM  Performed by: Sebastian Moles, MD Authorized by: Sebastian Moles, MD   Consent:    Consent obtained:  Emergent situation Pre-procedure details:    Skin preparation:  ChloraPrep Procedure details:    Placement location:  L lateral   Tube size (Fr):  28   Technique: blunt     Tube connected to:  Suction   Suture material:  0 silk   Dressing:  4x4 sterile gauze Post-procedure details:    Post-insertion x-ray findings: tube in good position   Comments:     800cc out initially  Moles Sebastian, MD, MPH, FACS Please use [email or web address] to contact on call provider
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
## 2024-01-30 NOTE — ED Notes (Signed)
 1L warm saline started

## 2024-01-30 NOTE — ED Notes (Signed)
 18 OG at the lip

## 2024-01-31 ENCOUNTER — Encounter (HOSPITAL_COMMUNITY): Payer: Self-pay | Admitting: Thoracic Surgery (Cardiothoracic Vascular Surgery)

## 2024-01-31 ENCOUNTER — Inpatient Hospital Stay (HOSPITAL_COMMUNITY)

## 2024-01-31 DIAGNOSIS — Z9889 Other specified postprocedural states: Secondary | ICD-10-CM

## 2024-01-31 LAB — BPAM FFP
Blood Product Expiration Date: 202511032359
Blood Product Expiration Date: 202511032359
Blood Product Expiration Date: 202511042359
Blood Product Expiration Date: 202511042359
Blood Product Expiration Date: 202511042359
ISSUE DATE / TIME: 202510301319
ISSUE DATE / TIME: 202510301319
Unit Type and Rh: 6200
Unit Type and Rh: 6200
Unit Type and Rh: 6200
Unit Type and Rh: 6200
Unit Type and Rh: 6200

## 2024-01-31 LAB — POCT I-STAT 7, (LYTES, BLD GAS, ICA,H+H)
Acid-base deficit: 5 mmol/L — ABNORMAL HIGH (ref 0.0–2.0)
Bicarbonate: 22.1 mmol/L (ref 20.0–28.0)
Calcium, Ion: 1.11 mmol/L — ABNORMAL LOW (ref 1.15–1.40)
HCT: 32 % — ABNORMAL LOW (ref 39.0–52.0)
Hemoglobin: 10.9 g/dL — ABNORMAL LOW (ref 13.0–17.0)
O2 Saturation: 89 %
Potassium: 4.2 mmol/L (ref 3.5–5.1)
Sodium: 143 mmol/L (ref 135–145)
TCO2: 24 mmol/L (ref 22–32)
pCO2 arterial: 50 mmHg — ABNORMAL HIGH (ref 32–48)
pH, Arterial: 7.254 — ABNORMAL LOW (ref 7.35–7.45)
pO2, Arterial: 65 mmHg — ABNORMAL LOW (ref 83–108)

## 2024-01-31 LAB — CBC
HCT: 29.4 % — ABNORMAL LOW (ref 39.0–52.0)
Hemoglobin: 10.6 g/dL — ABNORMAL LOW (ref 13.0–17.0)
MCH: 31.4 pg (ref 26.0–34.0)
MCHC: 36.1 g/dL — ABNORMAL HIGH (ref 30.0–36.0)
MCV: 87 fL (ref 80.0–100.0)
Platelets: 83 K/uL — ABNORMAL LOW (ref 150–400)
RBC: 3.38 MIL/uL — ABNORMAL LOW (ref 4.22–5.81)
RDW: 17.7 % — ABNORMAL HIGH (ref 11.5–15.5)
WBC: 13.8 K/uL — ABNORMAL HIGH (ref 4.0–10.5)
nRBC: 0 % (ref 0.0–0.2)

## 2024-01-31 LAB — PREPARE FRESH FROZEN PLASMA
Unit division: 0
Unit division: 0

## 2024-01-31 LAB — BASIC METABOLIC PANEL WITH GFR
Anion gap: 12 (ref 5–15)
BUN: 8 mg/dL (ref 6–20)
CO2: 22 mmol/L (ref 22–32)
Calcium: 7.6 mg/dL — ABNORMAL LOW (ref 8.9–10.3)
Chloride: 106 mmol/L (ref 98–111)
Creatinine, Ser: 0.88 mg/dL (ref 0.61–1.24)
GFR, Estimated: >60 mL/min (ref >60)
Glucose, Bld: 140 mg/dL — ABNORMAL HIGH (ref 70–99)
Potassium: 4 mmol/L (ref 3.5–5.1)
Sodium: 140 mmol/L (ref 135–145)

## 2024-01-31 LAB — CK: Total CK: 840 U/L — ABNORMAL HIGH (ref 49–397)

## 2024-01-31 LAB — TRIGLYCERIDES: Triglycerides: 33 mg/dL (ref <150)

## 2024-01-31 MED ORDER — LORAZEPAM 2 MG/ML IJ SOLN: 1.0000 mg | INTRAMUSCULAR | Status: AC | PRN

## 2024-01-31 MED ORDER — HYDROMORPHONE HCL 1 MG/ML IJ SOLN
0.5000 mg | INTRAMUSCULAR | Status: DC | PRN
  Administered 2024-02-03 – 2024-02-04 (×8): 1 mg via INTRAVENOUS
  Administered 2024-02-05 (×2): 0.5 mg via INTRAVENOUS
  Filled 2024-01-31 (×6): qty 1

## 2024-01-31 MED ORDER — METHOCARBAMOL 1000 MG/10ML IJ SOLN: 500.0000 mg | Freq: Three times a day (TID) | INTRAMUSCULAR | Status: AC

## 2024-01-31 MED ORDER — SODIUM CHLORIDE 0.9 % IV SOLN
4.0000 g | Freq: Once | INTRAVENOUS | Status: AC
  Administered 2024-01-31 (×2): 4 g via INTRAVENOUS
  Filled 2024-01-31: qty 40

## 2024-01-31 MED ORDER — ACETAMINOPHEN 500 MG PO TABS
1000.0000 mg | ORAL_TABLET | Freq: Four times a day (QID) | ORAL | Status: DC
  Administered 2024-01-31 – 2024-02-05 (×38): 1000 mg via ORAL
  Filled 2024-01-31 (×22): qty 2

## 2024-01-31 MED ORDER — THIAMINE HCL 100 MG/ML IJ SOLN
100.0000 mg | Freq: Every day | INTRAMUSCULAR | Status: DC
  Filled 2024-01-31: qty 2

## 2024-01-31 MED ORDER — LORAZEPAM 1 MG PO TABS
1.0000 mg | ORAL_TABLET | ORAL | Status: AC | PRN
  Administered 2024-01-31 (×8): 2 mg via ORAL
  Administered 2024-02-01 (×2): 1 mg via ORAL
  Filled 2024-01-31: qty 2
  Filled 2024-01-31: qty 1
  Filled 2024-01-31 (×3): qty 2

## 2024-01-31 MED ORDER — THIAMINE MONONITRATE 100 MG PO TABS
100.0000 mg | ORAL_TABLET | Freq: Every day | ORAL | Status: DC
  Administered 2024-01-31 – 2024-02-05 (×12): 100 mg via ORAL
  Filled 2024-01-31 (×6): qty 1

## 2024-01-31 MED ORDER — FOLIC ACID 1 MG PO TABS
1.0000 mg | ORAL_TABLET | Freq: Every day | ORAL | Status: DC
  Administered 2024-01-31 – 2024-02-05 (×12): 1 mg via ORAL
  Filled 2024-01-31 (×6): qty 1

## 2024-01-31 MED ORDER — METHOCARBAMOL 500 MG PO TABS
500.0000 mg | ORAL_TABLET | Freq: Three times a day (TID) | ORAL | Status: AC
  Administered 2024-01-31 – 2024-02-02 (×12): 500 mg via ORAL
  Filled 2024-01-31 (×6): qty 1

## 2024-01-31 MED ORDER — ADULT MULTIVITAMIN W/MINERALS CH
1.0000 | ORAL_TABLET | Freq: Every day | ORAL | Status: DC
  Administered 2024-01-31 – 2024-02-05 (×12): 1 via ORAL
  Filled 2024-01-31 (×6): qty 1

## 2024-01-31 MED FILL — Fentanyl Citrate Preservative Free (PF) Inj 2500 MCG/50ML: INTRAMUSCULAR | Qty: 50 | Status: AC

## 2024-01-31 MED FILL — Sodium Chloride IV Soln 0.9%: INTRAVENOUS | Qty: 250 | Status: AC
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
## 2024-01-31 NOTE — Progress Notes (Signed)
 Spoke with the patient's mother, Yolanda Ackers, about visitation. The two visitors will be the mother, and the patient's brother, Ulysess Novak.   The patient's father, Deward Calamity, will be coming from out of town tomorrow, but informed mom about the visitation policy being that the patient is confidential. Will let day shift charge RN know. TRN is aware.
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
## 2024-01-31 NOTE — Anesthesia Postprocedure Evaluation (Signed)
 Anesthesia Post Note  Patient: Willie Jacobs  Procedure(s) Performed: LEFT VIDEO ASSISTED THORACOSCOPY (VATS) WASHOUT (Left: Chest)     Patient location during evaluation: PACU Anesthesia Type: General Level of consciousness: awake and alert Pain management: pain level controlled Vital Signs Assessment: post-procedure vital signs reviewed and stable Respiratory status: spontaneous breathing, nonlabored ventilation, respiratory function stable and patient connected to nasal cannula oxygen Cardiovascular status: blood pressure returned to baseline and stable Postop Assessment: no apparent nausea or vomiting Anesthetic complications: no   No notable events documented.  Last Vitals:  Vitals:   01/31/24 0500 01/31/24 0530  BP: 118/80 119/78  Pulse: 61 63  Resp: 18 18  Temp: 36.9 C 36.9 C  SpO2: 100% 100%    Last Pain:  Vitals:   01/30/24 2200  TempSrc: Bladder  PainSc:                  Eddie Koc S
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
## 2024-01-31 NOTE — Plan of Care (Signed)
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
## 2024-01-31 NOTE — Plan of Care (Signed)
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
## 2024-01-31 NOTE — Progress Notes (Signed)
 Patient ID: Willie Jacobs, male   DOB: 16-Oct-1985, 38 y.o.   MRN: [id number] Doing well after extubation. He reports he drinks a 40oz plus a couple shots daily so will start CIWA. I spoke with his mother and brother at the bedside.  Dann Hummer, MD, MPH, FACS Please use [email or web address] to contact on call provider
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
## 2024-01-31 NOTE — Procedures (Signed)
 Extubation Procedure Note  Patient Details:   Name: Willie Jacobs DOB: 1985/07/25 MRN: [id number]   Airway Documentation:    Vent end date: (not recorded) Vent end time: (not recorded)   Evaluation  O2 sats: stable throughout Complications: No apparent complications Patient did tolerate procedure well. Bilateral Breath Sounds: Diminished, Rhonchi   Yes patient able to state name and he was in [place].  Tolerated well.Placed on 6 L HFNC sat 98%  Shona Bari Helling 01/31/2024, 8:40 AM

## 2024-01-31 NOTE — Progress Notes (Signed)
     [address]       Willie Jacobs [address]             [phone]       Chest tube output slowing, still somewhat dark, but hgb stable Will continue to watch for now  [employer]

## 2024-02-01 ENCOUNTER — Inpatient Hospital Stay (HOSPITAL_COMMUNITY)

## 2024-02-01 LAB — PREPARE FRESH FROZEN PLASMA
Unit division: 0
Unit division: 0
Unit division: 0
Unit division: 0
Unit division: 0
Unit division: 0
Unit division: 0
Unit division: 0
Unit division: 0
Unit division: 0

## 2024-02-01 LAB — BPAM FFP
Blood Product Expiration Date: 202511042359
Blood Product Expiration Date: 202511172359
Blood Product Unit Number: 202511042359
Blood Product Unit Number: 202511092359
Blood Product Unit Number: 202511112359
Blood Product Unit Number: 202511112359
Blood Product Unit Number: 202511112359
Blood Product Unit Number: 202511162359
ISSUE DATE / TIME: 202510301659
ISSUE DATE / TIME: 202510301659
ISSUE DATE / TIME: 202510301719
ISSUE DATE / TIME: 202510301719
ISSUE DATE / TIME: 202510301719
ISSUE DATE / TIME: 202510301719
ISSUE DATE / TIME: 202511042359
ISSUE DATE / TIME: 202511112359
ISSUE DATE / TIME: 202511172359
ISSUING PHYSICIAN: 202510301659
ISSUING PHYSICIAN: 202510301719
ISSUING PHYSICIAN: 202511042359
ISSUING PHYSICIAN: 202511172359
PRODUCT CODE: 202510301719
PRODUCT CODE: 202510301719
PRODUCT CODE: 202510301719
PRODUCT CODE: 202511042359
PRODUCT CODE: 202511042359
PRODUCT CODE: 202511092359
PRODUCT CODE: 202511112359
PRODUCT CODE: 202511162359
Unit Type and Rh: 202510301719
Unit Type and Rh: 202510301719
Unit Type and Rh: 202510301719
Unit Type and Rh: 202510301719
Unit Type and Rh: 202511042359
Unit Type and Rh: 202511042359
Unit Type and Rh: 202511112359
Unit Type and Rh: 202511162359
Unit Type and Rh: 202511162359
Unit Type and Rh: 600
Unit Type and Rh: 6200
Unit Type and Rh: 6200
Unit Type and Rh: 6200
Unit Type and Rh: 6200
Unit Type and Rh: 6200
Unit Type and Rh: 6200
Unit Type and Rh: 6200
Unit Type and Rh: 6200
Unit Type and Rh: 6200
Unit Type and Rh: 6200
Unit Type and Rh: 6200
Unit Type and Rh: 6200
Unit Type and Rh: 6200

## 2024-02-01 LAB — CBC
HCT: 27.7 % — ABNORMAL LOW (ref 39.0–52.0)
Hemoglobin: 9.6 g/dL — ABNORMAL LOW (ref 13.0–17.0)
MCH: 30.5 pg (ref 26.0–34.0)
MCHC: 34.7 g/dL (ref 30.0–36.0)
MCV: 87.9 fL (ref 80.0–100.0)
Platelets: 82 K/uL — ABNORMAL LOW (ref 150–400)
RBC: 3.15 MIL/uL — ABNORMAL LOW (ref 4.22–5.81)
RDW: 16.8 % — ABNORMAL HIGH (ref 11.5–15.5)
WBC: 13.8 K/uL — ABNORMAL HIGH (ref 4.0–10.5)
nRBC: 0 % (ref 0.0–0.2)

## 2024-02-01 LAB — BASIC METABOLIC PANEL WITH GFR
Anion gap: 8 (ref 5–15)
BUN: 8 mg/dL (ref 6–20)
CO2: 25 mmol/L (ref 22–32)
Calcium: 8.3 mg/dL — ABNORMAL LOW (ref 8.9–10.3)
Chloride: 104 mmol/L (ref 98–111)
Creatinine, Ser: 0.71 mg/dL (ref 0.61–1.24)
GFR, Estimated: >60 mL/min (ref >60)
Glucose, Bld: 104 mg/dL — ABNORMAL HIGH (ref 70–99)
Potassium: 3.7 mmol/L (ref 3.5–5.1)
Sodium: 137 mmol/L (ref 135–145)

## 2024-02-01 MED ORDER — ONDANSETRON HCL 4 MG/2ML IJ SOLN: 4.0000 mg | Freq: Four times a day (QID) | INTRAMUSCULAR | Status: DC | PRN

## 2024-02-01 MED ORDER — POLYETHYLENE GLYCOL 3350 17 G PO PACK
17.0000 g | PACK | Freq: Every day | ORAL | Status: DC | PRN
  Filled 2024-02-01: qty 1

## 2024-02-01 MED ORDER — ONDANSETRON HCL 4 MG PO TABS: 4.0000 mg | ORAL_TABLET | Freq: Four times a day (QID) | ORAL | Status: DC | PRN

## 2024-02-01 MED ORDER — DOCUSATE SODIUM 100 MG PO CAPS
100.0000 mg | ORAL_CAPSULE | Freq: Two times a day (BID) | ORAL | Status: DC
  Administered 2024-02-01 – 2024-02-05 (×18): 100 mg via ORAL
  Filled 2024-02-01 (×9): qty 1

## 2024-02-01 MED ORDER — OXYCODONE HCL 5 MG PO TABS
5.0000 mg | ORAL_TABLET | Freq: Four times a day (QID) | ORAL | Status: DC | PRN
  Administered 2024-02-01 – 2024-02-02 (×5): 10 mg via ORAL
  Administered 2024-02-02 (×3): 5 mg via ORAL
  Administered 2024-02-02: 10 mg via ORAL
  Administered 2024-02-02 – 2024-02-03 (×2): 5 mg via ORAL
  Administered 2024-02-03 (×2): 10 mg via ORAL
  Administered 2024-02-03: 5 mg via ORAL
  Administered 2024-02-04 – 2024-02-05 (×6): 10 mg via ORAL
  Filled 2024-02-01 (×5): qty 2
  Filled 2024-02-01: qty 1
  Filled 2024-02-01 (×2): qty 2
  Filled 2024-02-01: qty 1
  Filled 2024-02-01 (×2): qty 2

## 2024-02-01 NOTE — Plan of Care (Signed)
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
## 2024-02-02 ENCOUNTER — Encounter (HOSPITAL_COMMUNITY): Payer: Self-pay | Admitting: General Surgery

## 2024-02-02 DIAGNOSIS — S2193XA Puncture wound without foreign body of unspecified part of thorax, initial encounter: Secondary | ICD-10-CM | POA: Diagnosis not present

## 2024-02-02 DIAGNOSIS — F1024 Alcohol dependence with alcohol-induced mood disorder: Secondary | ICD-10-CM

## 2024-02-02 DIAGNOSIS — F430 Acute stress reaction: Secondary | ICD-10-CM

## 2024-02-02 LAB — CBC
HCT: 32.5 % — ABNORMAL LOW (ref 39.0–52.0)
Hemoglobin: 11.3 g/dL — ABNORMAL LOW (ref 13.0–17.0)
MCH: 31.1 pg (ref 26.0–34.0)
MCHC: 34.8 g/dL (ref 30.0–36.0)
MCV: 89.5 fL (ref 80.0–100.0)
Platelets: 115 K/uL — ABNORMAL LOW (ref 150–400)
RBC: 3.63 MIL/uL — ABNORMAL LOW (ref 4.22–5.81)
RDW: 15.7 % — ABNORMAL HIGH (ref 11.5–15.5)
WBC: 13.9 K/uL — ABNORMAL HIGH (ref 4.0–10.5)
nRBC: 0 % (ref 0.0–0.2)

## 2024-02-02 LAB — BASIC METABOLIC PANEL WITH GFR
Anion gap: 11 (ref 5–15)
BUN: 6 mg/dL (ref 6–20)
CO2: 27 mmol/L (ref 22–32)
Calcium: 8.3 mg/dL — ABNORMAL LOW (ref 8.9–10.3)
Chloride: 99 mmol/L (ref 98–111)
Creatinine, Ser: 0.79 mg/dL (ref 0.61–1.24)
GFR, Estimated: >60 mL/min (ref >60)
Glucose, Bld: 90 mg/dL (ref 70–99)
Potassium: 3.2 mmol/L — ABNORMAL LOW (ref 3.5–5.1)
Sodium: 137 mmol/L (ref 135–145)

## 2024-02-02 MED ORDER — ENOXAPARIN SODIUM 30 MG/0.3ML IJ SOSY
30.0000 mg | PREFILLED_SYRINGE | Freq: Two times a day (BID) | INTRAMUSCULAR | Status: DC
  Administered 2024-02-02 – 2024-02-05 (×14): 30 mg via SUBCUTANEOUS
  Filled 2024-02-02 (×7): qty 0.3

## 2024-02-02 MED ORDER — METHOCARBAMOL 500 MG PO TABS
500.0000 mg | ORAL_TABLET | Freq: Four times a day (QID) | ORAL | Status: DC | PRN
  Administered 2024-02-03 – 2024-02-05 (×6): 500 mg via ORAL
  Filled 2024-02-02 (×3): qty 1

## 2024-02-02 MED ORDER — HYDROXYZINE HCL 25 MG PO TABS
25.0000 mg | ORAL_TABLET | Freq: Three times a day (TID) | ORAL | Status: DC | PRN
  Administered 2024-02-03 (×2): 25 mg via ORAL
  Filled 2024-02-02: qty 1

## 2024-02-02 MED ORDER — POTASSIUM CHLORIDE CRYS ER 20 MEQ PO TBCR
40.0000 meq | EXTENDED_RELEASE_TABLET | Freq: Once | ORAL | Status: AC
  Administered 2024-02-02 (×2): 40 meq via ORAL
  Filled 2024-02-02: qty 2
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
## 2024-02-02 NOTE — Progress Notes (Addendum)
      [address] [place] [address]             [phone]         3 Days Post-Op Procedure(s) (LRB): LEFT VIDEO ASSISTED THORACOSCOPY (VATS) WASHOUT (Left)  Subjective:  Patient doing well.  Just got some pain medication and pain is well controlled.  Objective: Vital signs in last 24 hours: Temp:  [97.9 F (36.6 C)-99.6 F (37.6 C)] 98.9 F (37.2 C) (11/02 0400) Pulse Rate:  [90-120] 99 (11/02 0700) Cardiac Rhythm: Normal sinus rhythm;Sinus tachycardia (11/02 0800) Resp:  [17-31] 19 (11/02 0600) BP: (109-143)/(71-96) 128/92 (11/02 0700) SpO2:  [90 %-99 %] 97 % (11/02 0600)  Intake/Output from previous day: 11/01 0701 - 11/02 0700 In: 480 [P.O.:480] Out: 2345 [Urine:1775; Chest Tube:570]  General appearance: alert, cooperative, and no distress Heart: regular rate and rhythm Lungs: diminished breath sounds bibasilar Wound: clean and dry  Lab Results: Recent Labs    02/01/24 0447 02/02/24 0304  WBC 13.8* 13.9*  HGB 9.6* 11.3*  HCT 27.7* 32.5*  PLT 82* 115*   BMET:  Recent Labs    02/01/24 0447 02/02/24 0304  NA 137 137  K 3.7 3.2*  CL 104 99  CO2 25 27  GLUCOSE 104* 90  BUN 8 6  CREATININE 0.71 0.79  CALCIUM 8.3* 8.3*    PT/INR: No results for input(s): LABPROT, INR in the last 72 hours. ABG    Component Value Date/Time   PHART 7.326 (L) 01/30/2024 2206   HCO3 21.6 01/30/2024 2206   TCO2 23 01/30/2024 2206   ACIDBASEDEF 4.0 (H) 01/30/2024 2206   O2SAT 100 01/30/2024 2206   CBG (last 3)  No results for input(s): GLUCAP in the last 72 hours.  Assessment/Plan: S/P Procedure(s) (LRB): LEFT VIDEO ASSISTED THORACOSCOPY (VATS) WASHOUT (Left)  Chest tube- 540 cc output, no air leak present.. CXR not completed today.. repeat ordered for AM by primary service..... CT will remain in place as output is too high to remove (Pleurovac currently at 80 cc at 0940)  Care per trauma   LOS: 3 days    Rocky Shad,  PA-C 02/02/2024 10:21 AM  Agree with above. Chest tube output decreasing. CXR still pending today.
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
## 2024-02-02 NOTE — Progress Notes (Signed)
 RN received call from patients fiance.  Fiance stated she is coming to the hospital in 1 hour and wanted to know if staff would let her visit the pt.  At this time, pt limited to 2 visitors (mother and brother).  Fiance stated that she spoke with the pt on the phone and he said she would be allowed to visit because her family is going back home to [place].   Bedside RN, Kurstyn Larios, and Charge RN, Quintin, spoke with the pt and explained the visitation policy.  The pt stated that he was informed that he would be allowed to change the visitors listed because his mom and brother were driving back to [place].  Pt upset and stated that he would not have any visitors since his family is going back home.  Advised pt to contact fiance and inform her of restricted visitation.  Also, advised pt to wait until day shift to discuss changes to visitors listed.  Will report off to dayshift RN and Scientist, Forensic.  TRN updated by phone.

## 2024-02-03 ENCOUNTER — Inpatient Hospital Stay (HOSPITAL_COMMUNITY)

## 2024-02-03 LAB — TYPE AND SCREEN
ABO/RH(D): A POS
Antibody Screen: NEGATIVE
Unit division: 0
Unit division: 0
Unit division: 0
Unit division: 0
Unit division: 0
Unit division: 0
Unit division: 0
Unit division: 0
Unit division: 0
Unit division: 0
Unit division: 0
Unit division: 0
Unit division: 0
Unit division: 0
Unit division: 0
Unit division: 0
Unit division: 0
Unit division: 0
Unit division: 0

## 2024-02-03 LAB — BPAM RBC
Blood Product Expiration Date: 202511082359
Blood Product Expiration Date: 202511102359
Blood Product Expiration Date: 202511122359
Blood Product Expiration Date: 202511122359
Blood Product Expiration Date: 202511182359
Blood Product Expiration Date: 202511182359
Blood Product Expiration Date: 202511192359
Blood Product Expiration Date: 202511212359
Blood Product Expiration Date: 202511212359
Blood Product Expiration Date: 202511212359
Blood Product Expiration Date: 202511212359
Blood Product Expiration Date: 202511212359
Blood Product Expiration Date: 202511212359
Blood Product Expiration Date: 202511212359
Blood Product Expiration Date: 202511212359
Blood Product Expiration Date: 202511222359
Blood Product Expiration Date: 202511252359
Blood Product Expiration Date: 202511252359
Blood Product Expiration Date: 202511282359
ISSUE DATE / TIME: 202510301005
ISSUE DATE / TIME: 202510301012
ISSUE DATE / TIME: 202510301044
ISSUE DATE / TIME: 202510301138
ISSUE DATE / TIME: 202510301319
ISSUE DATE / TIME: 202510301659
ISSUE DATE / TIME: 202510301659
ISSUE DATE / TIME: 202510301717
ISSUE DATE / TIME: 202510301717
ISSUE DATE / TIME: 202510302204
ISSUE DATE / TIME: 202510310423
ISSUE DATE / TIME: 202510310519
ISSUE DATE / TIME: 202510310655
ISSUE DATE / TIME: 202510311023
ISSUE DATE / TIME: 202510311023
Unit Type and Rh: 5100
Unit Type and Rh: 5100
Unit Type and Rh: 5100
Unit Type and Rh: 5100
Unit Type and Rh: 6200
Unit Type and Rh: 6200
Unit Type and Rh: 6200
Unit Type and Rh: 6200
Unit Type and Rh: 6200
Unit Type and Rh: 6200
Unit Type and Rh: 6200
Unit Type and Rh: 6200
Unit Type and Rh: 6200
Unit Type and Rh: 6200
Unit Type and Rh: 6200
Unit Type and Rh: 6200
Unit Type and Rh: 6200
Unit Type and Rh: 6200
Unit Type and Rh: 6200

## 2024-02-03 LAB — CBC
HCT: 31.1 % — ABNORMAL LOW (ref 39.0–52.0)
Hemoglobin: 11 g/dL — ABNORMAL LOW (ref 13.0–17.0)
MCH: 31.1 pg (ref 26.0–34.0)
MCHC: 35.4 g/dL (ref 30.0–36.0)
MCV: 87.9 fL (ref 80.0–100.0)
Platelets: 180 K/uL (ref 150–400)
RBC: 3.54 MIL/uL — ABNORMAL LOW (ref 4.22–5.81)
RDW: 15 % (ref 11.5–15.5)
WBC: 16.2 K/uL — ABNORMAL HIGH (ref 4.0–10.5)
nRBC: 0 % (ref 0.0–0.2)

## 2024-02-03 NOTE — Progress Notes (Signed)
 Report given to [facility] nurse, Fletcher FALCON., RN. Patient belongings gathered and returned to patient with 4N charge nurse present. Patient transported to [facility] with no complications.

## 2024-02-03 NOTE — Plan of Care (Signed)

## 2024-02-03 NOTE — Progress Notes (Signed)
 OT Cancellation Note  Patient Details Name: Willie Jacobs MRN: [id number] DOB: 1985/09/04   Cancelled Treatment:    Reason Eval/Treat Not Completed:  (Pt decline at this time but agreebale to reattempt at a later time. Will continue to follow.)  Warrick POUR OTR/L  Acute Rehab Services  [phone] office number   Warrick Berber 02/03/2024, 8:19 AM

## 2024-02-03 NOTE — Plan of Care (Signed)
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
## 2024-02-03 NOTE — Progress Notes (Signed)
 Patient arrived in the unit alert and oriented and very anxious, he refuses CHG bath and skin assessment, c/o pain, and about the wires from the cardiac monitoring setting. Pain medication given per patient request. Will continue to monitor.
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
## 2024-02-03 NOTE — Progress Notes (Addendum)
      [address] [place] [employer] [address]             [phone]         4 Days Post-Op Procedure(s) (LRB): LEFT VIDEO ASSISTED THORACOSCOPY (VATS) WASHOUT (Left)  Subjective:  Having some pain along left chest.  Otherwise doing okay.  Objective: Vital signs in last 24 hours: Temp:  [98.4 F (36.9 C)-99.9 F (37.7 C)] 98.7 F (37.1 C) (11/03 0300) Pulse Rate:  [85-122] 101 (11/03 0300) Cardiac Rhythm: Normal sinus rhythm (11/03 0315) Resp:  [0-39] 12 (11/03 0300) BP: (111-149)/(73-104) 118/78 (11/03 0300) SpO2:  [86 %-100 %] 91 % (11/03 0300) Weight:  [83 kg] 83 kg (11/03 0300)  Intake/Output from previous day: 11/02 0701 - 11/03 0700 In: 1321 [P.O.:1321] Out: 1139 [Urine:930; Chest Tube:209]  General appearance: alert, cooperative, and no distress Heart: regular rate and rhythm Lungs: clear to auscultation bilaterally Wound: clean and dry  Lab Results: Recent Labs    02/02/24 0304 02/03/24 0346  WBC 13.9* 16.2*  HGB 11.3* 11.0*  HCT 32.5* 31.1*  PLT 115* 180   BMET:  Recent Labs    02/01/24 0447 02/02/24 0304  NA 137 137  K 3.7 3.2*  CL 104 99  CO2 25 27  GLUCOSE 104* 90  BUN 8 6  CREATININE 0.71 0.79  CALCIUM 8.3* 8.3*    PT/INR: No results for input(s): LABPROT, INR in the last 72 hours. ABG    Component Value Date/Time   PHART 7.326 (L) 01/30/2024 2206   HCO3 21.6 01/30/2024 2206   TCO2 23 01/30/2024 2206   ACIDBASEDEF 4.0 (H) 01/30/2024 2206   O2SAT 100 01/30/2024 2206   CBG (last 3)  No results for input(s): GLUCAP in the last 72 hours.  Assessment/Plan: S/P Procedure(s) (LRB): LEFT VIDEO ASSISTED THORACOSCOPY (VATS) WASHOUT (Left)  CT remains in place- 210 cc output yesterday (level at 290 in pleurovac).. no air leak Pulm- CXR with continued left hemothorax vs. Contusion  Chest tube management per Dr. Shyrl, care per trauma   LOS: 4 days    Rocky Shad, PA-C 02/03/2024 7:35  AM   Agree Would obtain CT chest prior to chest removal to eval for residual retained fluid collection.  If stable, can remove chest tube  Linnie MALVA Shyrl

## 2024-02-03 NOTE — Discharge Instructions (Signed)
 In a time of Crisis: Integrated family Services/Therapeutic Alternatives.  Mobile Crisis Management provides immediate crisis response, 24/7.  Call [phone] Depending on the county.  [facility] for MH/DD/[facility] is available 24 hours a day, 7 days a week. Customer Service Specialists will assist you to find a crisis provider that is well-matched with your needs. Your local number is: [phone]  [facility] ([facility]) IOP, individual counseling, medication management [phone] [place], [place] [address] [phone] Call for intake hours; Medicaid and Uninsured    Outpatient Providers  [facility] ([facility]) Group and individual counseling. [address]  [place], [place] [address] [phone] [place]: [phone]  [place]: [phone] Medicaid and uninsured.   [facility] Offers IOP groups multiple times per week. [address] [place], [place] [address] [phone] Takes Medicaid and other insurances.   [facility]  Chemical Dependency Intensive Outpatient Program (IOP) [address] [place], [place] [address] [phone] Takes Nurse, learning disability and [place].   [facility]  IOP and Partial Hospitalization Program  [address]  [place], [place] [address] [phone] Private Insurance, [place] only for partial hospitalization  [facility] [address], [place], [place] [address] [phone] Monday-Friday. Short and Long term options. [facility] ([facility]) IOP, individual counseling, medication management [address] [place], [place] [address] [phone] Medicaid and [facility]  [facility] [address]  [place], [place] [address] [phone] Private Insurance and Self Pay    [facility] Outpatient [address]  [place], [place] [address] [phone] Private Insurance, [place], and Self Pay   [facility]  [address], [place] [address] [facility]  [address]  [place], [place] [address] [phone]  [facility]  [address] [place], [place] [address] [phone]  [facility]  Locations in [place], [place], [place], [place].  [phone] Accepts all insurances and some uninsured.   Residential Treatment Programs  [facility] [address] [place], [place] [address] [phone] or [phone] Detox and Residential Rehab 21 days (Medicaid, private insurance, and self pay. If Medicare, will look into funding). No methadone. Call for pre-screen.   [facility]) [address]  [place], [place] [address] [phone] Detox 3-7 days (self Pay and Medicaid Limited). Transitional Program for females needs 60 days clean first.  Rehab Only for Males 60 days (Medicare, and Self Pay)-No methadone.  [facility] [address] [place], [place] [address] [phone] or [phone] Private Insurance only  [facility] PHONE: [phone] FAX: [phone] Residential program for women 21 and over for up to a year through a Christian 12-step recovery model. Self-pay.    [facility] [address] [place], [place] [address] Phone:  [phone] Must be detoxed 72 hours prior to admission; 28 day program.  Self-pay.  [facility] [address]  [place], [place] [phone] [employer], Medicare, [place] (not straight [place]). They offer assistance with transportation.   [facility] [address] [place],  [place], [place] [address] [phone] Christian Based Program. Men only. No insurance  [facility] [address] [place], [place] [address] Women's: [phone] Men's: [phone] No Medicaid.   [facility]  [address]  [place], [place] [address] [phone] Treatment Only, must make assessment appointment, and must be sober for assessment appointment. Self pay, [facility], must be [facility] resident. No methadone.   [facility]  [address] [place], [place] [address] [phone] No pending legal charges, Long-term work program. No methadone. Call for assessment.  [facility]  [address], [place], [place] [address] [phone] or [phone] Commercial Insurance Only  [facility] - [phone] Private Insurance (no [place]). Males/Females, call to make referrals, multiple facilities.   [facility] [address]  [place], [place] [address]  [phone] Men Only Upfront Fee  [facility]-no methadone: [facility] [address] [place], [place] [address] [phone] ([phone] (f)  [facility] Locations across the [place] (mainly [place] ) willing to help with transportation.  [phone] [employer].     AA Meetings Meeting Locator:  [email or web address] Also can download an app on that website.  [facility] Program Due to COVID-19, syringe services programs are likely operating under different hours with limited or no fixed site hours. Some programs may not be operating at all. Please contact the program directly using the phone numbers provided below to see if they are still operating under COVID-19. [facility] Solution to the Opioid Problem (GCSTOP) Fixed; mobile; peer-based [place] [phone] [email or web address] Fixed site exchange at [facility], [address] [place], [place] [address] on Wednesdays (2:00 - 5:00 pm) and Thursdays  (4:00 - 8:00 pm). Pop-up mobile exchange locations: [employer] and [employer], [address] [place], [place] [address] on Tuesdays (11:00 am - 1:00 pm) and Fridays (11:00 am - 1:00 pm) -[facility] - [address], [place], [place] [address] on Tuesdays (2:00 - 4:00 pm) and Fridays (2:00 - 4:00 pm) -[facility] -Fixed; mobile; peer-based [phone] [address], [place], [place] [address] Delivery and outreach available in [place] and [place], please call for more information. Monday, Tuesday: 1:00 -7:00 pm, Thursday: 4:00 pm - 8:00 pm, Friday: 1:00 pm - 8:00 pm) Medication-Assisted Treatment (MAT) -[facility]: Locations in [place], [place], [place], [place]. Accepts all insurances and some uninsured. Methadone, Suboxone, etc. Closed on weekends. Walk ins before 11am.  Call [phone]  -[facility]- services [place] and surrounding areas including [place], [place], [place], [place], [address], [address], [place], [place], [place], and [place], [place]. Options include Methadone, buprenorphine or Suboxone. [address], [place], [place] [address] Phone: [phone] [place] - Fri: 5:30am - 2:00pm, Sat: 5:30am -7:30am, Sun: Closed  -[facility]- We use FDA-approved medications, like methadone/suboxone/sublocade, and vivitrol. These medications are then combined with customized care plans that include individual or group counseling, toxicology, and medical care directed by on-site physicians. Accepts most insurance plans, Medicaid, and private pay.  [address] [place], [place] [address] Phone: [phone] Monday-Friday 5:00 AM - 10:00 AM, Saturday 6:00 AM - 8:30 AM, Sunday 6:00 AM - 7:00 AM  -[facility]- [facility] is a treatment & recovery focused program. In addition to receiving methadone medication, our clients participate in individual and group counseling as well as random  drug testing. If accepted into the [facility] Opioid Program, you will be provided several intake appointments and a physical exam [address] [place], [place] [address] Office: [phone]  Fax: [phone]  -[facility]- We put our community members at the  center of everything we do, for remote treatment services as well as in-person, from alcohol withdrawal to opioid use and more.  [address], [place], [place] [address] [phone] Monday-Wednesday: 9:00am - 5:00pm, Thursday: 9:00am - 6:00pm, Friday: 9:00am - 5:00pm Saturday: 9:00am - 1:00pm, Sunday: Closed  -[facility]- Our clinic in [place], a medication unit, offers daily dosing of methadone or buprenorphine in addition to our clinic in [place] offering counseling to help people overcome addiction to heroin and other opiates. We also offer psychiatric services including medication management, and an office based suboxone program. [address], [place], [place] [address] Phone [phone]  [facility]-We treat Opioid Addiction using medications that are a combination of buprenorphine and naloxone, which are used to treat adults addicted to narcotic painkillers and drugs such as heroin. They reduce the intense cravings and painful symptoms that accompany withdrawal. [address], [place], [place] Phone: [phone]  -[facility] (Or [place]): $12/daily for Methadone Treatment.  [address], [place], [place] [address] [phone] [address] [place], [place] [address]  -[facility]: Suboxone only.  [address], [place], [place] [address] P. [phone]  -[facility] [address] [place], [place] [address] P. [phone]

## 2024-02-03 NOTE — TOC Progression Note (Signed)
 Transition of Care [facility]) - Progression Note    Patient Details  Name: Willie Jacobs MRN: [id number] Date of Birth: 1986/01/18  [facility]) CM/SW Contact  Stellarose Cerny E Rhealynn Myhre, LCSW Phone Number: 02/03/2024, 11:41 AM  Clinical Narrative:    SA resources added to AVS.     Barriers to Discharge: Continued Medical Work up               Expected Discharge Plan and Services       Living arrangements for the past 2 months: Apartment                                       Social Drivers of Health (SDOH) Interventions SDOH Screenings   Tobacco Use: High Risk (02/02/2024)    Readmission Risk Interventions     No data to display

## 2024-02-04 ENCOUNTER — Inpatient Hospital Stay (HOSPITAL_COMMUNITY)

## 2024-02-04 LAB — CBC
HCT: 31.3 % — ABNORMAL LOW (ref 39.0–52.0)
Hemoglobin: 11 g/dL — ABNORMAL LOW (ref 13.0–17.0)
MCH: 31 pg (ref 26.0–34.0)
MCHC: 35.1 g/dL (ref 30.0–36.0)
MCV: 88.2 fL (ref 80.0–100.0)
Platelets: 233 K/uL (ref 150–400)
RBC: 3.55 MIL/uL — ABNORMAL LOW (ref 4.22–5.81)
RDW: 14.8 % (ref 11.5–15.5)
WBC: 15.3 K/uL — ABNORMAL HIGH (ref 4.0–10.5)
nRBC: 0 % (ref 0.0–0.2)

## 2024-02-04 NOTE — Progress Notes (Signed)
 Chest tube removed  per order . Pt tolerated well. Bedrest for 1 hour
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
## 2024-02-04 NOTE — Plan of Care (Signed)
  Problem: Clinical Measurements: Goal: Ability to maintain clinical measurements within normal limits will improve Outcome: Progressing Goal: Will remain free from infection Outcome: Progressing Goal: Diagnostic test results will improve Outcome: Progressing Goal: Respiratory complications will improve Outcome: Progressing Goal: Cardiovascular complication will be avoided Outcome: Progressing   Problem: Education: Goal: Knowledge of General Education information will improve Description: Including pain rating scale, medication(s)/side effects and non-pharmacologic comfort measures Outcome: Progressing   Problem: Activity: Goal: Risk for activity intolerance will decrease Outcome: Progressing   Problem: Nutrition: Goal: Adequate nutrition will be maintained Outcome: Progressing   Problem: Coping: Goal: Level of anxiety will decrease Outcome: Progressing   Problem: Elimination: Goal: Will not experience complications related to bowel motility Outcome: Progressing Goal: Will not experience complications related to urinary retention Outcome: Progressing   Problem: Pain Managment: Goal: General experience of comfort will improve and/or be controlled Outcome: Progressing   Problem: Safety: Goal: Ability to remain free from injury will improve Outcome: Progressing   Problem: Skin Integrity: Goal: Risk for impaired skin integrity will decrease Outcome: Progressing

## 2024-02-04 NOTE — Progress Notes (Signed)
 Mobility Specialist Progress Note;   02/04/24 1523  Mobility  Activity Ambulated independently  Level of Assistance Standby assist, set-up cues, supervision of patient - no hands on  Assistive Device None  Distance Ambulated (ft) 200 ft  Activity Response Tolerated well  Mobility Referral Yes  Mobility visit 1 Mobility  Mobility Specialist Start Time (ACUTE ONLY) 1523  Mobility Specialist Stop Time (ACUTE ONLY) 1530  Mobility Specialist Time Calculation (min) (ACUTE ONLY) 7 min   Pt agreeable to mobility, post chest tube removal (bed rest orders over). Required no physical assistance during ambulation, SV for safety. HR up to 122 bpm w/ exertion. VSS on RA. Pt states feeling much better after removal of chest tube. Pt returned back to bed and left with all needs met. RN notified.   Lauraine Erm Mobility Specialist Please contact via SecureChat or [employer] [phone]
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
## 2024-02-04 NOTE — Progress Notes (Signed)
   Progress Note   Date: 02/04/2024  Patient Name: Willie Jacobs        MRN#: [id number]   Traumatic rhabdomyolysis
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
## 2024-02-05 ENCOUNTER — Other Ambulatory Visit (HOSPITAL_COMMUNITY): Payer: Self-pay

## 2024-02-05 MED ORDER — ADULT MULTIVITAMIN W/MINERALS CH: 1.0000 | ORAL_TABLET | Freq: Every day | ORAL | Status: AC

## 2024-02-05 MED ORDER — ACETAMINOPHEN 500 MG PO TABS: 1000.0000 mg | ORAL_TABLET | Freq: Four times a day (QID) | ORAL | Status: AC

## 2024-02-05 MED ORDER — VITAMIN B-1 100 MG PO TABS: 100.0000 mg | ORAL_TABLET | Freq: Every day | ORAL | Status: AC

## 2024-02-05 MED ORDER — METHOCARBAMOL 500 MG PO TABS
500.0000 mg | ORAL_TABLET | Freq: Four times a day (QID) | ORAL | 0 refills | Status: DC | PRN
  Filled 2024-02-05: qty 15, 4d supply, fill #0

## 2024-02-05 MED ORDER — OXYCODONE HCL 5 MG PO TABS
5.0000 mg | ORAL_TABLET | Freq: Four times a day (QID) | ORAL | 0 refills | Status: DC | PRN
  Filled 2024-02-05: qty 25, 3d supply, fill #0

## 2024-02-05 NOTE — Progress Notes (Signed)
 Pt refused shower chair, VSS, IV removed, education complete. Pt Discharging  Laneta JAYSON Rao, RN 02/05/2024 2:32 PM

## 2024-02-05 NOTE — TOC Transition Note (Signed)
 Transition of Care (TOC) - Discharge Note Rayfield Gobble RN, BSN Inpatient Care Management Unit 4E- RN Case Manager See Treatment Team for direct phone # Trauma cross coverage  Patient Details  Name: Willie Jacobs MRN: [id number] Date of Birth: March 14, 1986  Transition of [facility]) CM/SW Contact:  Gobble Rayfield Hurst, RN Phone Number: 02/05/2024, 3:20 PM   Clinical Narrative:    Pt stable for transition home today, no HH or DME needs noted. Pt is ambulating independently.   Pt has transportation home.   No IP CM intervention needs noted for discharge.    Final next level of care: Home/Self Care Barriers to Discharge: Barriers Resolved   Patient Goals and CMS Choice Patient states their goals for this hospitalization and ongoing recovery are:: return home   Choice offered to / list presented to : NA      Discharge Placement               Home        Discharge Plan and Services Additional resources added to the After Visit Summary for   In-house Referral: NA Discharge Planning Services: NA Post Acute Care Choice: NA          DME Arranged: N/A DME Agency: NA       HH Arranged: NA HH Agency: NA        Social Drivers of Health (SDOH) Interventions SDOH Screenings   Tobacco Use: High Risk (02/02/2024)     Readmission Risk Interventions     No data to display

## 2024-02-05 NOTE — Plan of Care (Signed)
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
## 2024-02-06 ENCOUNTER — Encounter (HOSPITAL_COMMUNITY): Payer: Self-pay | Admitting: Emergency Medicine

## 2024-02-12 ENCOUNTER — Other Ambulatory Visit (HOSPITAL_COMMUNITY): Payer: Self-pay

## 2024-02-12 ENCOUNTER — Ambulatory Visit: Payer: Self-pay

## 2024-02-12 VITALS — BP 114/76 | HR 103 | Resp 18 | Ht 72.0 in | Wt 193.0 lb

## 2024-02-12 DIAGNOSIS — J942 Hemothorax: Secondary | ICD-10-CM

## 2024-02-12 DIAGNOSIS — Y249XXA Unspecified firearm discharge, undetermined intent, initial encounter: Secondary | ICD-10-CM

## 2024-02-12 MED ORDER — METHOCARBAMOL 500 MG PO TABS
500.0000 mg | ORAL_TABLET | Freq: Four times a day (QID) | ORAL | 0 refills | Status: AC | PRN
  Filled 2024-02-12: qty 15, 4d supply, fill #0

## 2024-02-12 MED ORDER — OXYCODONE HCL 5 MG PO TABS
5.0000 mg | ORAL_TABLET | Freq: Four times a day (QID) | ORAL | 0 refills | Status: AC | PRN
  Filled 2024-02-12: qty 20, 5d supply, fill #0

## 2024-02-12 MED ORDER — MUPIROCIN 2 % EX OINT
1.0000 | TOPICAL_OINTMENT | Freq: Two times a day (BID) | CUTANEOUS | 0 refills | Status: AC
  Filled 2024-02-12: qty 22, 30d supply, fill #0
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
## 2024-02-12 NOTE — Addendum Note (Signed)
 Addended by: RUTHA SHAVER on: 02/12/2024 05:22 PM   Modules accepted: Level of Service

## 2024-02-12 NOTE — Addendum Note (Signed)
 Addended by: RUTHA SHAVER on: 02/12/2024 03:25 PM   Modules accepted: Orders

## 2024-02-13 NOTE — Patient Instructions (Addendum)
-  Call [facility] in [facility] for chest xray and follow up.  Phone number [phone]  -Follow up in 2 weeks with chest xray (if unable to get with [facility])

## 2024-02-25 ENCOUNTER — Other Ambulatory Visit: Payer: Self-pay | Admitting: Thoracic Surgery (Cardiothoracic Vascular Surgery)

## 2024-02-25 DIAGNOSIS — Y249XXA Unspecified firearm discharge, undetermined intent, initial encounter: Secondary | ICD-10-CM

## 2024-02-26 ENCOUNTER — Telehealth: Payer: Self-pay | Admitting: *Deleted

## 2024-02-26 ENCOUNTER — Ambulatory Visit: Payer: Self-pay

## 2024-02-26 ENCOUNTER — Other Ambulatory Visit (HOSPITAL_COMMUNITY): Payer: Self-pay

## 2024-02-26 ENCOUNTER — Ambulatory Visit (HOSPITAL_COMMUNITY)
Admission: RE | Admit: 2024-02-26 | Discharge: 2024-02-26 | Disposition: A | Discharge status: Home / Self Care | Source: Ambulatory Visit | Attending: Cardiology | Admitting: Cardiology

## 2024-02-26 VITALS — BP 154/75 | HR 94 | Resp 18 | Ht 72.0 in | Wt 193.0 lb

## 2024-02-26 DIAGNOSIS — Y249XXA Unspecified firearm discharge, undetermined intent, initial encounter: Secondary | ICD-10-CM | POA: Insufficient documentation

## 2024-02-26 DIAGNOSIS — J90 Pleural effusion, not elsewhere classified: Secondary | ICD-10-CM | POA: Diagnosis not present

## 2024-02-26 DIAGNOSIS — S21139A Puncture wound without foreign body of unspecified front wall of thorax without penetration into thoracic cavity, initial encounter: Secondary | ICD-10-CM | POA: Diagnosis present

## 2024-02-26 DIAGNOSIS — J942 Hemothorax: Secondary | ICD-10-CM
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
## 2024-02-26 NOTE — Patient Instructions (Signed)
-  Follow up with [facility] as needed -Please establish with a PCP for further care -Pain clinic referral placed

## 2024-05-08 ENCOUNTER — Telehealth: Payer: Self-pay | Admitting: Radiology
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
